# Patient Record
Sex: Female | Born: 1995 | Hispanic: Yes | Marital: Single | Smoking: Never smoker
Health system: Southern US, Community
[De-identification: ages and names within clinical notes are randomized; demographics above are authoritative.]

## PROBLEM LIST (undated history)

## (undated) ENCOUNTER — Inpatient Hospital Stay (HOSPITAL_COMMUNITY): Payer: Self-pay

## (undated) DIAGNOSIS — N390 Urinary tract infection, site not specified: Secondary | ICD-10-CM

## (undated) DIAGNOSIS — Z283 Underimmunization status: Secondary | ICD-10-CM

## (undated) DIAGNOSIS — N760 Acute vaginitis: Secondary | ICD-10-CM

## (undated) DIAGNOSIS — B9689 Other specified bacterial agents as the cause of diseases classified elsewhere: Secondary | ICD-10-CM

## (undated) DIAGNOSIS — O9989 Other specified diseases and conditions complicating pregnancy, childbirth and the puerperium: Secondary | ICD-10-CM

## (undated) HISTORY — DX: Other specified diseases and conditions complicating pregnancy, childbirth and the puerperium: O99.89

## (undated) HISTORY — DX: Other specified bacterial agents as the cause of diseases classified elsewhere: B96.89

## (undated) HISTORY — DX: Acute vaginitis: N76.0

## (undated) HISTORY — PX: NO PAST SURGERIES: SHX2092

## (undated) HISTORY — DX: Underimmunization status: Z28.3

---
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
## 2016-01-19 ENCOUNTER — Encounter: Payer: Self-pay | Admitting: Pediatrics

## 2016-01-20 ENCOUNTER — Other Ambulatory Visit: Payer: Self-pay | Admitting: Physician Assistant

## 2016-01-20 ENCOUNTER — Encounter: Payer: Self-pay | Admitting: Pediatrics

## 2016-01-20 DIAGNOSIS — N63 Unspecified lump in unspecified breast: Secondary | ICD-10-CM

## 2016-01-21 ENCOUNTER — Other Ambulatory Visit: Payer: Self-pay | Admitting: Physician Assistant

## 2016-01-21 ENCOUNTER — Ambulatory Visit
Admission: RE | Admit: 2016-01-21 | Discharge: 2016-01-21 | Disposition: A | Discharge status: Home / Self Care | Payer: No Typology Code available for payment source | Source: Ambulatory Visit | Attending: Physician Assistant | Admitting: Physician Assistant

## 2016-01-21 DIAGNOSIS — N6310 Unspecified lump in the right breast, unspecified quadrant: Secondary | ICD-10-CM

## 2016-12-21 ENCOUNTER — Other Ambulatory Visit: Payer: Self-pay | Admitting: Physician Assistant

## 2016-12-21 DIAGNOSIS — N63 Unspecified lump in unspecified breast: Secondary | ICD-10-CM

## 2017-01-01 ENCOUNTER — Other Ambulatory Visit: Payer: Self-pay | Admitting: Physician Assistant

## 2017-01-01 ENCOUNTER — Ambulatory Visit
Admission: RE | Admit: 2017-01-01 | Discharge: 2017-01-01 | Disposition: A | Discharge status: Home / Self Care | Payer: Self-pay | Source: Ambulatory Visit | Attending: Physician Assistant | Admitting: Physician Assistant

## 2017-01-01 DIAGNOSIS — N63 Unspecified lump in unspecified breast: Secondary | ICD-10-CM

## 2017-01-01 DIAGNOSIS — N6310 Unspecified lump in the right breast, unspecified quadrant: Secondary | ICD-10-CM

## 2017-03-01 ENCOUNTER — Other Ambulatory Visit: Payer: Self-pay | Admitting: Physician Assistant

## 2017-03-01 ENCOUNTER — Other Ambulatory Visit: Payer: Self-pay

## 2017-03-01 DIAGNOSIS — N6310 Unspecified lump in the right breast, unspecified quadrant: Secondary | ICD-10-CM

## 2017-03-06 ENCOUNTER — Ambulatory Visit
Admission: RE | Admit: 2017-03-06 | Discharge: 2017-03-06 | Disposition: A | Discharge status: Home / Self Care | Payer: PRIVATE HEALTH INSURANCE | Source: Ambulatory Visit | Attending: Physician Assistant | Admitting: Physician Assistant

## 2017-03-06 ENCOUNTER — Other Ambulatory Visit: Payer: Self-pay | Admitting: Physician Assistant

## 2017-03-06 DIAGNOSIS — N6310 Unspecified lump in the right breast, unspecified quadrant: Secondary | ICD-10-CM

## 2018-05-03 ENCOUNTER — Other Ambulatory Visit (HOSPITAL_COMMUNITY)
Admission: RE | Admit: 2018-05-03 | Discharge: 2018-05-03 | Disposition: A | Discharge status: Home / Self Care | Payer: PRIVATE HEALTH INSURANCE | Source: Ambulatory Visit | Attending: Physician Assistant | Admitting: Physician Assistant

## 2018-05-03 ENCOUNTER — Ambulatory Visit (INDEPENDENT_AMBULATORY_CARE_PROVIDER_SITE_OTHER): Payer: Self-pay | Admitting: Physician Assistant

## 2018-05-03 ENCOUNTER — Other Ambulatory Visit: Payer: Self-pay

## 2018-05-03 ENCOUNTER — Encounter (INDEPENDENT_AMBULATORY_CARE_PROVIDER_SITE_OTHER): Payer: Self-pay | Admitting: Physician Assistant

## 2018-05-03 VITALS — BP 106/68 | HR 77 | Temp 97.9°F | Ht <= 58 in | Wt 108.4 lb

## 2018-05-03 DIAGNOSIS — N898 Other specified noninflammatory disorders of vagina: Secondary | ICD-10-CM | POA: Insufficient documentation

## 2018-05-03 DIAGNOSIS — Z202 Contact with and (suspected) exposure to infections with a predominantly sexual mode of transmission: Secondary | ICD-10-CM

## 2018-05-03 DIAGNOSIS — N949 Unspecified condition associated with female genital organs and menstrual cycle: Secondary | ICD-10-CM | POA: Insufficient documentation

## 2018-05-03 MED ORDER — CEFTRIAXONE SODIUM 250 MG IJ SOLR
250.0000 mg | Freq: Once | INTRAMUSCULAR | Status: AC
  Administered 2018-05-03: 250 mg via INTRAMUSCULAR

## 2018-05-03 MED ORDER — DOXYCYCLINE HYCLATE 100 MG PO TABS: 100.0000 mg | ORAL_TABLET | Freq: Two times a day (BID) | ORAL | 0 refills | Status: DC

## 2018-05-03 NOTE — Progress Notes (Signed)
Subjective:  Patient ID: Candace Watkins, female    DOB: 06/27/1995  Age: 22 y.o. MRN: [id number]  CC: yeast infection  HPI Candace Watkins is a 22 y.o. female with a medical history of chlamydial infection and adjustment disorder with mixed depression and anxiety presents as a new patient with vaginal discharge, itching, and burning. Vaginal discharge yellowish in color. Has tried antifungal ovule with no relief of symptoms. Having sexual intercourse with female partner whom reportedly has untreated chlamydia. Does not endorse any other symptoms or complaints.        Outpatient Medications Prior to Visit  Medication Sig Dispense Refill  . FLUoxetine (PROZAC) 10 MG capsule Take 1 capsule (10 mg total) by mouth daily. (Patient not taking: Reported on 10/19/2014) 30 capsule 0   No facility-administered medications prior to visit.      ROS Review of Systems  Constitutional: Negative for chills, fever and malaise/fatigue.  Eyes: Negative for blurred vision.  Respiratory: Negative for shortness of breath.   Cardiovascular: Negative for chest pain and palpitations.  Gastrointestinal: Negative for abdominal pain and nausea.  Genitourinary: Negative for dysuria and hematuria.       Vaginal discharge and itching  Musculoskeletal: Negative for joint pain and myalgias.  Skin: Negative for rash.  Neurological: Negative for tingling and headaches.  Psychiatric/Behavioral: Negative for depression. The patient is not nervous/anxious.     Objective:  BP 106/68 (BP Location: Left Arm, Patient Position: Sitting, Cuff Size: Normal)   Pulse 77   Temp 97.9 F (36.6 C) (Oral)   Ht 4\' 10"  (1.473 m)   Wt 108 lb 6.4 oz (49.2 kg)   LMP 04/02/2018 (Exact Date)   SpO2 100%   BMI 22.66 kg/m   BP/Weight 05/03/2018 10/19/2014 91/02/1659  Systolic BP 600 - 90  Diastolic BP 68 - 58  Wt. (Lbs) 108.4 94 88  BMI 22.66 - 18.08      Physical Exam  Constitutional: She is oriented  to person, place, and time.  Well developed, well nourished, NAD, polite  HENT:  Head: Normocephalic and atraumatic.  Eyes: No scleral icterus.  Neck: Normal range of motion. Neck supple. No thyromegaly present.  Pulmonary/Chest: Effort normal.  Abdominal: Soft. Bowel sounds are normal. There is no tenderness.  Genitourinary:  Genitourinary Comments: No vulvar lesions, yellowish/greenish vaginal discharge, cervix erythematous with mild cervical motion tenderness, no adnexal mass or tenderness bilaterally, no uterine mass or tenderness.  Musculoskeletal: She exhibits no edema.  Neurological: She is alert and oriented to person, place, and time.  Skin: Skin is warm and dry. No rash noted. No erythema. No pallor.  Psychiatric: She has a normal mood and affect. Her behavior is normal. Thought content normal.  Vitals reviewed.    Assessment & Plan:   1. Exposure to STD - Administered cefTRIAXone (ROCEPHIN) injection 250 mg - Begin doxycycline (VIBRA-TABS) 100 MG tablet; Take 1 tablet (100 mg total) by mouth 2 (two) times daily.  Dispense: 20 tablet; Refill: 0  2. Vaginal burning - Cytology - PAP([place])  3. Vaginal discharge - Cytology - PAP([place])   Meds ordered this encounter  Medications  . cefTRIAXone (ROCEPHIN) injection 250 mg  . doxycycline (VIBRA-TABS) 100 MG tablet    Sig: Take 1 tablet (100 mg total) by mouth 2 (two) times daily.    Dispense:  20 tablet    Refill:  0    Order Specific Question:   Supervising Provider    Answer:   Margarita Rana,  ENOBONG [[id number]]    Follow-up: Return if symptoms worsen or fail to improve.   Clent Demark PA

## 2018-05-03 NOTE — Patient Instructions (Signed)
Cervicitis Cervicitis is when the cervix gets irritated and swollen. Your cervix is the lower end of your uterus. Follow these instructions at home:  Do not have sex until your doctor says it is okay.  Take over-the-counter and prescription medicines only as told by your doctor.  If you were prescribed an antibiotic medicine, take it as told by your doctor. Do not stop taking it even if you start to feel better.  Keep all follow-up visits as told by your doctor. This is important. Contact a doctor if:  Your symptoms come back after treatment.  Your symptoms get worse after treatment.  You have a fever.  You feel tired (fatigued).  Your belly (abdomen) hurts.  You feel like you are going to throw up (are nauseous).  You throw up (vomit).  You have watery poop (diarrhea).  Your back hurts. Get help right away if:  You have very bad pain in your belly, and medicine does not help it.  You cannot pee (urinate). Summary  Cervicitis is when the cervix gets irritated and swollen.  Do not have sex until your doctor says it is okay.  If you need to take an antibiotic, do not stop taking even if you start to feel better. Take medicines only as told by your doctor. This information is not intended to replace advice given to you by your health care provider. Make sure you discuss any questions you have with your health care provider. Document Released: 03/21/2008 Document Revised: 02/27/2016 Document Reviewed: 02/27/2016 Elsevier Interactive Patient Education  2017 [employer].

## 2018-05-08 ENCOUNTER — Telehealth (INDEPENDENT_AMBULATORY_CARE_PROVIDER_SITE_OTHER): Payer: Self-pay

## 2018-05-08 LAB — CYTOLOGY - PAP
Bacterial vaginitis: NEGATIVE
Candida vaginitis: NEGATIVE
Chlamydia: NEGATIVE
Diagnosis: NEGATIVE
Neisseria Gonorrhea: NEGATIVE
TRICH (WINDOWPATH): NEGATIVE

## 2018-05-08 NOTE — Telephone Encounter (Signed)
-----   Message from Clent Demark, PA-C sent at 05/08/2018  2:00 PM EST ----- Pap is completely negative.

## 2018-05-08 NOTE — Telephone Encounter (Signed)
Have partner get tested and stop having sex with anyone suspected of having an STD.

## 2018-05-08 NOTE — Telephone Encounter (Signed)
Patient is aware that pap is completely negative. She did pick up abx but never began taking it. She is still having sex with the same partner. He has never been tested or treated for chlamydia. Patient wants to know if she should still take abx. Please advise. CMA did advise patient to have partner get tested. Nat Christen, CMA

## 2018-05-09 NOTE — Telephone Encounter (Signed)
Patient informed. Trig Mcbryar S Christy Friede, CMA  

## 2018-11-20 ENCOUNTER — Telehealth: Payer: Self-pay

## 2018-11-20 DIAGNOSIS — Z20822 Contact with and (suspected) exposure to covid-19: Secondary | ICD-10-CM
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
## 2018-11-21 ENCOUNTER — Other Ambulatory Visit: Payer: Self-pay

## 2018-11-21 DIAGNOSIS — Z20822 Contact with and (suspected) exposure to covid-19: Secondary | ICD-10-CM

## 2018-11-22 LAB — NOVEL CORONAVIRUS, NAA: SARS-CoV-2, NAA: NOT DETECTED

## 2018-11-26 ENCOUNTER — Telehealth: Payer: Self-pay

## 2018-11-26 NOTE — Telephone Encounter (Signed)
Patient called and asked if her family member could call this number to be tested, a child under 40. I advised to contact the child's PCP, if no PCP contact the [facility] Department, she verbalized understanding.

## 2020-12-03 ENCOUNTER — Telehealth: Payer: Self-pay

## 2020-12-03 NOTE — Telephone Encounter (Signed)
Telephoned patient at home number. Left a voice message with [facility] contact information. 

## 2020-12-08 ENCOUNTER — Other Ambulatory Visit: Payer: Self-pay

## 2020-12-08 DIAGNOSIS — N63 Unspecified lump in unspecified breast: Secondary | ICD-10-CM

## 2020-12-23 ENCOUNTER — Ambulatory Visit: Payer: Self-pay

## 2020-12-23 ENCOUNTER — Other Ambulatory Visit: Payer: Self-pay

## 2021-08-31 ENCOUNTER — Emergency Department (HOSPITAL_COMMUNITY): Payer: Self-pay

## 2021-08-31 ENCOUNTER — Emergency Department (HOSPITAL_COMMUNITY)
Admission: EM | Admit: 2021-08-31 | Discharge: 2021-08-31 | Disposition: A | Discharge status: Home / Self Care | Payer: Self-pay | Attending: Emergency Medicine | Admitting: Emergency Medicine

## 2021-08-31 DIAGNOSIS — R1031 Right lower quadrant pain: Secondary | ICD-10-CM | POA: Insufficient documentation

## 2021-08-31 DIAGNOSIS — R102 Pelvic and perineal pain: Secondary | ICD-10-CM | POA: Insufficient documentation

## 2021-08-31 LAB — URINALYSIS, ROUTINE W REFLEX MICROSCOPIC
Bacteria, UA: NONE SEEN
Bilirubin Urine: NEGATIVE
Glucose, UA: NEGATIVE mg/dL
Ketones, ur: NEGATIVE mg/dL
Nitrite: NEGATIVE
Protein, ur: NEGATIVE mg/dL
Specific Gravity, Urine: 1.02 (ref 1.005–1.030)
pH: 7 (ref 5.0–8.0)

## 2021-08-31 LAB — COMPREHENSIVE METABOLIC PANEL
ALT: 45 U/L — ABNORMAL HIGH (ref 0–44)
AST: 55 U/L — ABNORMAL HIGH (ref 15–41)
Albumin: 4 g/dL (ref 3.5–5.0)
Alkaline Phosphatase: 62 U/L (ref 38–126)
Anion gap: 7 (ref 5–15)
BUN: 10 mg/dL (ref 6–20)
CO2: 24 mmol/L (ref 22–32)
Calcium: 9 mg/dL (ref 8.9–10.3)
Chloride: 108 mmol/L (ref 98–111)
Creatinine, Ser: 0.51 mg/dL (ref 0.44–1.00)
GFR, Estimated: >60 mL/min (ref >60)
Glucose, Bld: 104 mg/dL — ABNORMAL HIGH (ref 70–99)
Potassium: 3.6 mmol/L (ref 3.5–5.1)
Sodium: 139 mmol/L (ref 135–145)
Total Bilirubin: 0.1 mg/dL — ABNORMAL LOW (ref 0.3–1.2)
Total Protein: 7.8 g/dL (ref 6.5–8.1)

## 2021-08-31 LAB — CBC WITH DIFFERENTIAL/PLATELET
Abs Immature Granulocytes: 0.02 10*3/uL (ref 0.00–0.07)
Basophils Absolute: 0.1 10*3/uL (ref 0.0–0.1)
Basophils Relative: 1 %
Eosinophils Absolute: 0.1 10*3/uL (ref 0.0–0.5)
Eosinophils Relative: 1 %
HCT: 26.6 % — ABNORMAL LOW (ref 36.0–46.0)
Hemoglobin: 7 g/dL — ABNORMAL LOW (ref 12.0–15.0)
Immature Granulocytes: 0 %
Lymphocytes Relative: 38 %
Lymphs Abs: 2.5 10*3/uL (ref 0.7–4.0)
MCH: 17.3 pg — ABNORMAL LOW (ref 26.0–34.0)
MCHC: 26.3 g/dL — ABNORMAL LOW (ref 30.0–36.0)
MCV: 65.7 fL — ABNORMAL LOW (ref 80.0–100.0)
Monocytes Absolute: 0.6 10*3/uL (ref 0.1–1.0)
Monocytes Relative: 10 %
Neutro Abs: 3.2 10*3/uL (ref 1.7–7.7)
Neutrophils Relative %: 50 %
Platelets: 429 10*3/uL — ABNORMAL HIGH (ref 150–400)
RBC: 4.05 MIL/uL (ref 3.87–5.11)
RDW: 19.2 % — ABNORMAL HIGH (ref 11.5–15.5)
WBC: 6.5 10*3/uL (ref 4.0–10.5)
nRBC: 0 % (ref 0.0–0.2)

## 2021-08-31 LAB — I-STAT BETA HCG BLOOD, ED (MC, WL, AP ONLY): I-stat hCG, quantitative: <5 m[IU]/mL (ref <5)

## 2021-08-31 LAB — WET PREP, GENITAL
Clue Cells Wet Prep HPF POC: NONE SEEN
Sperm: NONE SEEN
Trich, Wet Prep: NONE SEEN
WBC, Wet Prep HPF POC: <10 (ref <10)
Yeast Wet Prep HPF POC: NONE SEEN

## 2021-08-31 LAB — LIPASE, BLOOD: Lipase: 38 U/L (ref 11–51)

## 2021-08-31 MED ORDER — IOHEXOL 300 MG/ML  SOLN
80.0000 mL | Freq: Once | INTRAMUSCULAR | Status: AC | PRN
  Administered 2021-08-31: 80 mL via INTRAVENOUS

## 2021-08-31 MED ORDER — IBUPROFEN 800 MG PO TABS
800.0000 mg | ORAL_TABLET | Freq: Once | ORAL | Status: AC
  Administered 2021-08-31: 800 mg via ORAL
  Filled 2021-08-31: qty 1

## 2021-08-31 MED ORDER — NAPROXEN 500 MG PO TABS: 500.0000 mg | ORAL_TABLET | Freq: Two times a day (BID) | ORAL | 0 refills | Status: DC

## 2021-08-31 NOTE — ED Provider Triage Note (Signed)
Emergency Medicine Provider Triage Evaluation Note ? ?Marni Griffon , a 26 y.o. female  was evaluated in triage.  Pt complains of right lower quadrant abdominal pain, began on Monday, constant in nature.  Does not radiate.  Does have some white vaginal discharge.  Denies any pelvic changes.  No fever, emesis, dysuria, hematuria.  Denies chance of pregnancy. ? ?Review of Systems  ?Positive: Right lower quadrant abdominal pain ?Negative: Nausea, vomiting, diarrhea ? ?Physical Exam  ?BP 122/71   Pulse 95   Temp 98 ?F (36.7 ?C)   Resp 18   Ht '4\' 11"'$  (1.499 m)   Wt 55.3 kg   SpO2 97%   BMI 24.64 kg/m?  ?Gen:   Awake, no distress   ?Resp:  Normal effort  ?MSK:   Moves extremities without difficulty  ?ABD:  Tenderness to RLQ ?Other:   ? ?Medical Decision Making  ?Medically screening exam initiated at 7:07 PM.  Appropriate orders placed.  Jazzlene Huot was informed that the remainder of the evaluation will be completed by another provider, this initial triage assessment does not replace that evaluation, and the importance of remaining in the ED until their evaluation is complete. ? ?RLQ abd pain ?  ?English Craighead A, PA-C ?08/31/21 1908 ? ?

## 2021-08-31 NOTE — ED Provider Notes (Signed)
[facility] DEPT Provider Note   CSN: [id number] Arrival date & time: 08/31/21  1850    History  Chief Complaint  Patient presents with   Abdominal Pain    Candace Watkins is a 26 y.o. female here for evaluation of right lower quadrant pain.  Began on Monday.  Constant in nature.  Does admit to some white vaginal discharge.  Does not radiate to back.  She denies any fever, chills, nausea, vomiting, abnormal vaginal bleeding.  Does states she has heavy menstrual cycles at baseline.  She denies prior history of anemia.  Pain not worse with movement.  Normal bowel movements.  HPI     Home Medications Prior to Admission medications   Medication Sig Start Date End Date Taking? Authorizing Provider  naproxen (NAPROSYN) 500 MG tablet Take 1 tablet (500 mg total) by mouth 2 (two) times daily. 08/31/21  Yes Tamira Ryland A, PA-C  doxycycline (VIBRA-TABS) 100 MG tablet Take 1 tablet (100 mg total) by mouth 2 (two) times daily. 05/03/18   Clent Demark, PA-C      Allergies    Patient has no known allergies.    Review of Systems   Review of Systems  Constitutional: Negative.   HENT: Negative.    Respiratory: Negative.    Cardiovascular: Negative.   Gastrointestinal:  Positive for abdominal pain (rlq pain). Negative for abdominal distention, anal bleeding, blood in stool, constipation, diarrhea, nausea, rectal pain and vomiting.  Genitourinary:  Positive for vaginal discharge.  Musculoskeletal: Negative.   Skin: Negative.   Neurological: Negative.   All other systems reviewed and are negative.  Physical Exam Updated Vital Signs BP 116/73    Pulse 65    Temp 98 F (36.7 C)    Resp 18    Ht $R'4\' 11"'ph$  (1.499 m)    Wt 55.3 kg    SpO2 100%    BMI 24.64 kg/m  Physical Exam Vitals and nursing note reviewed.  Constitutional:      General: She is not in acute distress.    Appearance: She is well-developed. She is not ill-appearing,  toxic-appearing or diaphoretic.  HENT:     Head: Normocephalic and atraumatic.     Mouth/Throat:     Mouth: Mucous membranes are moist.  Eyes:     Pupils: Pupils are equal, round, and reactive to light.  Cardiovascular:     Rate and Rhythm: Normal rate.     Heart sounds: Normal heart sounds.  Pulmonary:     Effort: Pulmonary effort is normal. No respiratory distress.     Breath sounds: Normal breath sounds.  Abdominal:     General: There is no distension.     Tenderness: There is abdominal tenderness in the right lower quadrant. There is no right CVA tenderness, left CVA tenderness, guarding or rebound. Negative signs include Murphy's sign.     Hernia: No hernia is present.  Genitourinary:    Comments: Normal appearing external female genitalia without rashes or lesions, normal vaginal epithelium. Normal appearing cervix with scant discharge or petechiae. Cervical os is closed. There is no bleeding noted at the os. no Odor. Bimanual: No CMT, non-tender.  No palpable adnexal masses or tenderness. Uterus midline and not fixed. Rectovaginal exam was deferred.  No cystocele or rectocele noted. No pelvic lymphadenopathy noted. Wet prep was obtained.  Cultures for gonorrhea and chlamydia collected. Exam performed with chaperone in room.   Musculoskeletal:        General: Normal  range of motion.     Cervical back: Normal range of motion.  Skin:    General: Skin is warm and dry.  Neurological:     General: No focal deficit present.     Mental Status: She is alert.  Psychiatric:        Mood and Affect: Mood normal.    ED Results / Procedures / Treatments   Labs (all labs ordered are listed, but only abnormal results are displayed) Labs Reviewed  CBC WITH DIFFERENTIAL/PLATELET - Abnormal; Notable for the following components:      Result Value   Hemoglobin 7.0 (*)    HCT 26.6 (*)    MCV 65.7 (*)    MCH 17.3 (*)    MCHC 26.3 (*)    RDW 19.2 (*)    Platelets 429 (*)    All other  components within normal limits  COMPREHENSIVE METABOLIC PANEL - Abnormal; Notable for the following components:   Glucose, Bld 104 (*)    AST 55 (*)    ALT 45 (*)    Total Bilirubin 0.1 (*)    All other components within normal limits  URINALYSIS, ROUTINE W REFLEX MICROSCOPIC - Abnormal; Notable for the following components:   Color, Urine STRAW (*)    Hgb urine dipstick SMALL (*)    Leukocytes,Ua MODERATE (*)    All other components within normal limits  WET PREP, GENITAL  LIPASE, BLOOD  I-STAT BETA HCG BLOOD, ED ([facility], [facility], [facility] ONLY)  GC/CHLAMYDIA PROBE AMP ([employer]) NOT AT [facility]    EKG None  Radiology CT ABDOMEN PELVIS W CONTRAST  Result Date: 08/31/2021 CLINICAL DATA:  Right lower quadrant pain. EXAM: CT ABDOMEN AND PELVIS WITH CONTRAST TECHNIQUE: Multidetector CT imaging of the abdomen and pelvis was performed using the standard protocol following bolus administration of intravenous contrast. RADIATION DOSE REDUCTION: This exam was performed according to the departmental dose-optimization program which includes automated exposure control, adjustment of the mA and/or kV according to patient size and/or use of iterative reconstruction technique. CONTRAST:  40mL OMNIPAQUE IOHEXOL 300 MG/ML  SOLN COMPARISON:  None. FINDINGS: Lower chest: No acute abnormality. Hepatobiliary: No focal liver abnormality is seen. No gallstones, gallbladder wall thickening, or biliary dilatation. Pancreas: Unremarkable. No pancreatic ductal dilatation or surrounding inflammatory changes. Spleen: Normal in size without focal abnormality. Adrenals/Urinary Tract: Adrenal glands are unremarkable. Kidneys are normal, without renal calculi, focal lesion, or hydronephrosis. Bladder is unremarkable. Stomach/Bowel: Stomach is within normal limits. Appendix appears normal. No evidence of bowel wall thickening, distention, or inflammatory changes. Vascular/Lymphatic: No significant vascular findings are present. No enlarged  abdominal or pelvic lymph nodes. Reproductive: Uterus and bilateral adnexa are unremarkable. Other: No abdominal wall hernia or abnormality. No abdominopelvic ascites. Musculoskeletal: No acute or significant osseous findings. IMPRESSION: No acute or active process within the abdomen or pelvis. Electronically Signed   By: Virgina Norfolk M.D.   On: 08/31/2021 21:02   US PELVIC COMPLETE W TRANSVAGINAL AND TORSION R/O  Result Date: 08/31/2021 CLINICAL DATA:  Pelvic pain eccentric to the right. EXAM: TRANSABDOMINAL AND TRANSVAGINAL ULTRASOUND OF PELVIS DOPPLER ULTRASOUND OF OVARIES TECHNIQUE: Both transabdominal and transvaginal ultrasound examinations of the pelvis were performed. Transabdominal technique was performed for global imaging of the pelvis including uterus, ovaries, adnexal regions, and pelvic cul-de-sac. It was necessary to proceed with endovaginal exam following the transabdominal exam to visualize the ovaries and adnexal spaces better. Color and duplex Doppler ultrasound was utilized to evaluate blood flow to the ovaries.  COMPARISON:  The CT with IV contrast earlier today and the pelvic ultrasound 04/06/2014 FINDINGS: Uterus Measurements: 6.2 x 2.8 x 4.5 cm = volume: 41.5 mL. No fibroids or other mass visualized. The cervix is unremarkable measuring 2.1 cm length. Endometrium Thickness: 5 mm.  No focal abnormality visualized. Right ovary Measurements: 3.7 x 2.1 x 2.4 cm = volume: 10.0 mL. Normal appearance/no adnexal mass. There are small scattered simple follicles. Left ovary Measurements: 3.0 x 2.0 x 1.6 cm = volume: 5.0 mL. Normal appearance/no adnexal mass. There are small scattered simple follicles. Pulsed Doppler evaluation of both ovaries demonstrates normal low-resistance arterial and venous waveforms. Other findings There is a small amount of anechoic free fluid adjacent to both ovaries. IMPRESSION: 1. Unremarkable uterus and ovaries. 2. Small amount of free fluid, which appears anechoic  and uncomplicated. This is nonspecific but frequently physiologic at this patient's age. This was also seen on the prior ultrasound in 2015. Electronically Signed   By: Almira Bar M.D.   On: 08/31/2021 22:44    Procedures Procedures    Medications Ordered in ED Medications  iohexol (OMNIPAQUE) 300 MG/ML solution 80 mL (80 mLs Intravenous Contrast Given 08/31/21 2044)  ibuprofen (ADVIL) tablet 800 mg (800 mg Oral Given 08/31/21 2318)   ED Course/ Medical Decision Making/ A&P    25 here for evaluation of right lower quadrant pain which began on Monday.  Pain constant nature.  Does not radiate.  Has noted some white vaginal discharge however denies any pelvic pain, abnormal vaginal bleeding.  No changes in bowel movements.  Pain does not radiate.  Labs and imaging personally viewed and interpreted:  CBC without leukocytosis, hemoglobin 7.0, no recent to compare, patient does admit to heavy menstrual cycles at baseline.  Not currently menstruating. Metabolic panel glucose 104, AST 55, ALT 45, normal Tbili and alk phos UA neg for infection Preg neg Lipase 38 CT [facility] without acute abnormality US pelvic with small amount of free fluid in pelvis, uncomplicated  GU exam with scant discharge in vaginal vault.  No CMT or adnexal tenderness  Patient is nontoxic, nonseptic appearing, in no apparent distress.  Patient's pain and other symptoms adequately managed in emergency department.  Fluid bolus given.  Labs, imaging and vitals reviewed.  Patient does not meet the SIRS or Sepsis criteria.  On repeat exam patient does not have a surgical abdomin and there are no peritoneal signs.  No indication of appendicitis, bowel obstruction, bowel perforation, cholecystitis, diverticulitis, PID, torsion, TOA.  Patient discharged home with symptomatic treatment and given strict instructions for follow-up with their primary care physician.  I have also discussed reasons to return immediately to the ER.  Patient  expresses understanding and agrees with plan.                            Medical Decision Making Amount and/or Complexity of Data Reviewed Labs: ordered. Radiology: ordered. ECG/medicine tests: ordered.  Risk Prescription drug management.         Final Clinical Impression(s) / ED Diagnoses Final diagnoses:  Pelvic pain  RLQ abdominal pain    Rx / DC Orders ED Discharge Orders          Ordered    naproxen (NAPROSYN) 500 MG tablet  2 times daily        08/31/21 2315              Cythina Mickelsen A, PA-C 08/31/21 2325  Gareth Morgan, MD 09/01/21 0001

## 2021-08-31 NOTE — Discharge Instructions (Addendum)
Your CT scan and your ultrasound did not show any significant abnormality.  It is unclear why you are having this abdominal pain it could possible be due to a rupture cyst.  I have written you for a medication to help with your symptoms.  Follow-up with primary care provider return for new or worsening symptoms ?

## 2021-08-31 NOTE — ED Triage Notes (Signed)
Pt states she has right lower abdominal pain that started 2 days ago. Pt reports nausea. ?

## 2021-09-01 LAB — GC/CHLAMYDIA PROBE AMP (~~LOC~~) NOT AT ARMC
Chlamydia: NEGATIVE
Comment: NEGATIVE
Comment: NORMAL
Neisseria Gonorrhea: NEGATIVE

## 2021-09-21 ENCOUNTER — Other Ambulatory Visit: Payer: Self-pay

## 2021-09-21 ENCOUNTER — Emergency Department (HOSPITAL_COMMUNITY)
Admission: EM | Admit: 2021-09-21 | Discharge: 2021-09-21 | Disposition: A | Discharge status: Home / Self Care | Payer: Self-pay | Attending: Emergency Medicine | Admitting: Emergency Medicine

## 2021-09-21 ENCOUNTER — Emergency Department (HOSPITAL_COMMUNITY): Payer: Self-pay

## 2021-09-21 DIAGNOSIS — S0990XA Unspecified injury of head, initial encounter: Secondary | ICD-10-CM | POA: Diagnosis present

## 2021-09-21 DIAGNOSIS — Y92410 Unspecified street and highway as the place of occurrence of the external cause: Secondary | ICD-10-CM | POA: Diagnosis not present

## 2021-09-21 DIAGNOSIS — S0083XA Contusion of other part of head, initial encounter: Secondary | ICD-10-CM | POA: Insufficient documentation

## 2021-09-21 DIAGNOSIS — N3000 Acute cystitis without hematuria: Secondary | ICD-10-CM | POA: Insufficient documentation

## 2021-09-21 DIAGNOSIS — S8002XA Contusion of left knee, initial encounter: Secondary | ICD-10-CM | POA: Insufficient documentation

## 2021-09-21 DIAGNOSIS — S299XXA Unspecified injury of thorax, initial encounter: Secondary | ICD-10-CM | POA: Insufficient documentation

## 2021-09-21 DIAGNOSIS — S3991XA Unspecified injury of abdomen, initial encounter: Secondary | ICD-10-CM | POA: Insufficient documentation

## 2021-09-21 LAB — URINALYSIS, ROUTINE W REFLEX MICROSCOPIC
Bilirubin Urine: NEGATIVE
Glucose, UA: NEGATIVE mg/dL
Ketones, ur: NEGATIVE mg/dL
Nitrite: POSITIVE — AB
Protein, ur: NEGATIVE mg/dL
Specific Gravity, Urine: 1.025 (ref 1.005–1.030)
pH: 6.5 (ref 5.0–8.0)

## 2021-09-21 LAB — COMPREHENSIVE METABOLIC PANEL
ALT: 26 U/L (ref 0–44)
AST: 24 U/L (ref 15–41)
Albumin: 3.7 g/dL (ref 3.5–5.0)
Alkaline Phosphatase: 63 U/L (ref 38–126)
Anion gap: 8 (ref 5–15)
BUN: 17 mg/dL (ref 6–20)
CO2: 23 mmol/L (ref 22–32)
Calcium: 9.3 mg/dL (ref 8.9–10.3)
Chloride: 105 mmol/L (ref 98–111)
Creatinine, Ser: 0.74 mg/dL (ref 0.44–1.00)
GFR, Estimated: >60 mL/min (ref >60)
Glucose, Bld: 94 mg/dL (ref 70–99)
Potassium: 3.9 mmol/L (ref 3.5–5.1)
Sodium: 136 mmol/L (ref 135–145)
Total Bilirubin: 0.6 mg/dL (ref 0.3–1.2)
Total Protein: 7.2 g/dL (ref 6.5–8.1)

## 2021-09-21 LAB — URINALYSIS, MICROSCOPIC (REFLEX)

## 2021-09-21 LAB — PREGNANCY, URINE: Preg Test, Ur: NEGATIVE

## 2021-09-21 MED ORDER — NITROFURANTOIN MONOHYD MACRO 100 MG PO CAPS: 100.0000 mg | ORAL_CAPSULE | Freq: Two times a day (BID) | ORAL | 0 refills | Status: DC

## 2021-09-21 MED ORDER — FLUCONAZOLE 200 MG PO TABS: 200.0000 mg | ORAL_TABLET | Freq: Every day | ORAL | 0 refills | Status: AC

## 2021-09-21 MED ORDER — IBUPROFEN 600 MG PO TABS
600.0000 mg | ORAL_TABLET | Freq: Four times a day (QID) | ORAL | 0 refills | Status: DC | PRN
Start: 2021-09-21 — End: 2023-01-15

## 2021-09-21 MED ORDER — METHOCARBAMOL 500 MG PO TABS
500.0000 mg | ORAL_TABLET | Freq: Two times a day (BID) | ORAL | 0 refills | Status: DC
Start: 2021-09-21 — End: 2023-01-15

## 2021-09-21 MED ORDER — IOHEXOL 300 MG/ML  SOLN
100.0000 mL | Freq: Once | INTRAMUSCULAR | Status: AC | PRN
  Administered 2021-09-21: 100 mL via INTRAVENOUS

## 2021-09-21 NOTE — ED Notes (Signed)
Pt verbalized understanding of d/c instructions, meds, and followup care. Denies questions. VSS, no distress noted. Steady gait to exit with all belongings.  ?
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
## 2021-12-22 DIAGNOSIS — Z3A11 11 weeks gestation of pregnancy: Secondary | ICD-10-CM | POA: Insufficient documentation

## 2021-12-22 DIAGNOSIS — Z640 Problems related to unwanted pregnancy: Secondary | ICD-10-CM | POA: Insufficient documentation

## 2023-01-15 ENCOUNTER — Encounter (HOSPITAL_BASED_OUTPATIENT_CLINIC_OR_DEPARTMENT_OTHER): Payer: Self-pay | Admitting: Family Medicine

## 2023-01-15 ENCOUNTER — Ambulatory Visit (INDEPENDENT_AMBULATORY_CARE_PROVIDER_SITE_OTHER): Payer: Self-pay | Admitting: Family Medicine

## 2023-01-15 VITALS — BP 107/75 | HR 64 | Ht 59.0 in | Wt 126.1 lb

## 2023-01-15 DIAGNOSIS — Z01818 Encounter for other preprocedural examination: Secondary | ICD-10-CM | POA: Insufficient documentation

## 2023-01-15 DIAGNOSIS — Z7689 Persons encountering health services in other specified circumstances: Secondary | ICD-10-CM | POA: Insufficient documentation

## 2023-01-15 NOTE — Assessment & Plan Note (Signed)
Patient presents today for pre-operative clearance. Patient in no acute distress and is well-appearing. Denies chest pain, shortness of breath, lower extremity edema, vision changes, headaches. Cardiovascular exam with heart regular rate and rhythm. Normal heart sounds, no murmurs present. No lower extremity edema present. Lungs clear to auscultation bilaterally. Reviewed required labs from plastic surgeon- will obtain today and update patient with results. Once results return, will fax labs and surgical clearance paperwork to office. EKG completed in office today, sinus arrhythmia at 63. No acute concerns. Order for CXR placed.

## 2023-01-15 NOTE — Progress Notes (Signed)
Subjective:    Candace Watkins 30-Oct-1995 01/15/2023  HPI: Patient is a 27 y.o. female who is here to establish care and is interested in receiving preoperative clearance for [place] butt-lift surgery. She reports it is currently scheduled for August 9th. Patient provided surgical clearance paperwork to be completed and faxed.   Past medical history includes: none Medications: none  Past surgical history: none  NKDA  1) High Risk Cardiac Conditions:  1) Recent MI - No.  2) Decompensated Heart Failure - No.  3) Unstable angina - No.  4) Symptomatic arrythmia - No.  5) Sx Valvular Disease - No.  2) Intermediate Risk Factors: DM, CKD, CVA, CHF, CAD - No.  2) Functional Status: > 4 mets (Walk, run, climb stairs) Yes.  Candace Watkins Activity Status Index: 58.2 points   3) Surgery Specific Risk: Intermediate      4) Further Noninvasive evaluation:   1) EKG - Yes.  Requested by Engineer, petroleum.   No PMHx of MI, CVA, CAD, DM, CKD  2) Echo - No.  3) Stress Testing - No. No active cardiac disease.  4) CXR Yes.  Requested by surgeon.    If asymptomatic, healthy, no respiratory symptoms it is not needed  5) PFTs No.   No history of OSA, OHS, or significant cardiopulmonary history  5) Need for medical therapy - Beta Blocker, Statins indicated ? No.  New complaints: None, no concerns today   Social history:  Relevant past medical, surgical, family and social history reviewed and updated as indicated.  Allergies and medications reviewed and updated.  Patient Active Problem List   Diagnosis Date Noted   Encounter to establish care 01/15/2023   Pre-operative clearance 01/15/2023   Past Medical History:  Diagnosis Date   Anemia 07/05/2013   Started on iron 1/10     Presence of subdermal contraceptive device 11/13/2013   Rubella non-immune status, antepartum 07/09/2013   Past Surgical History:  Procedure Laterality Date   NO PAST SURGERIES     Family History   Problem Relation Age of Onset   Diabetes Mother    Hypertension Mother    Hyperlipidemia Father    Diabetes Paternal Grandmother    Outpatient Medications Prior to Visit  Medication Sig Dispense Refill   ibuprofen (ADVIL) 600 MG tablet Take 1 tablet (600 mg total) by mouth every 6 (six) hours as needed. (Patient not taking: Reported on 01/15/2023) 30 tablet 0   methocarbamol (ROBAXIN) 500 MG tablet Take 1 tablet (500 mg total) by mouth 2 (two) times daily. (Patient not taking: Reported on 01/15/2023) 14 tablet 0   nitrofurantoin, macrocrystal-monohydrate, (MACROBID) 100 MG capsule Take 1 capsule (100 mg total) by mouth 2 (two) times daily. (Patient not taking: Reported on 01/15/2023) 10 capsule 0   No facility-administered medications prior to visit.   No Known Allergies  DATA REVIEWED: CHART IN EPIC  ROS: Negative unless specifically indicated above in HPI.   No current outpatient medications on file.      Objective:    Today's Vitals   01/15/23 0818  BP: 107/75  Pulse: 64  Weight: 126 lb 1.6 oz (57.2 kg)  Height: 4\' 11"  (1.499 m)    Wt Readings from Last 3 Encounters:  01/15/23 126 lb 1.6 oz (57.2 kg)  09/21/21 125 lb (56.7 kg)  08/31/21 122 lb (55.3 kg)    GENERAL: Well-appearing, in NAD. Well nourished.  SKIN: Pink, warm and dry. No rash, lesion, ulceration, or ecchymoses.  Head: Normocephalic. NECK: Trachea midline. Full ROM w/o pain or tenderness. No lymphadenopathy.  EARS: Tympanic membranes are intact, translucent without bulging and without drainage. Appropriate landmarks visualized.  EYES: Conjunctiva clear without exudates. EOMI, PERRL, no drainage present.  THROAT: Uvula midline. Oropharynx clear. Tonsils non-inflamed without exudate. Mucous membranes pink and moist.  RESPIRATORY: Chest wall symmetrical. Respirations even and non-labored. Breath sounds clear to auscultation bilaterally.  CARDIAC: S1, S2 present, regular rate and rhythm without murmur or  gallops. Peripheral pulses 2+ bilaterally.  MSK: Muscle tone and strength appropriate for age. Joints w/o tenderness, redness, or swelling.  EXTREMITIES: Without clubbing, cyanosis, or edema.  NEUROLOGIC: No motor or sensory deficits. Steady, even gait. C2-C12 intact.  PSYCH/MENTAL STATUS: Alert, oriented x 3. Cooperative, appropriate mood and affect.    No results found for any visits on 01/15/23.  The ASCVD Risk score (Arnett DK, et al., 2019) failed to calculate for the following reasons:   The 2019 ASCVD risk score is only valid for ages 70 to 78      Assessment & Plan:  Encounter to establish care Assessment & Plan: Patient is a pleasant 27 year-old female who presents today to establish care with new primary care provider. Reviewed the past medical history, family history, social history, surgical history, and allergies today- updates made as indicated. Patient is here today to obtain surgical clearance for [place] butt lift, scheduled for 02/02/2023. Patient has no concerns today.    Pre-operative clearance Assessment & Plan: Patient presents today for pre-operative clearance. Patient in no acute distress and is well-appearing. Denies chest pain, shortness of breath, lower extremity edema, vision changes, headaches. Cardiovascular exam with heart regular rate and rhythm. Normal heart sounds, no murmurs present. No lower extremity edema present. Lungs clear to auscultation bilaterally. Reviewed required labs from plastic surgeon- will obtain today and update patient with results. Once results return, will fax labs and surgical clearance paperwork to office. EKG completed in office today, sinus arrhythmia at 63. No acute concerns. Order for CXR placed.     Orders: -     Comprehensive metabolic panel -     CBC with Differential/Platelet -     PT and PTT -     Rapid HIV screen (HIV 1/2 Ab+Ag) -     hCG, quantitative, pregnancy -     Hemoglobin A1c -     TSH+T4F+T3Free -      Urinalysis, Routine w reflex microscopic -     EKG 12-Lead -     DG Chest 2 View -     EKG 12-Lead    I have independently evaluated patient.  Candace Watkins is a 27 y.o. female who is low risk for an intermediate-risk surgery.  There are not modifiable risk factors (smoking, etc).   Return in about 4 months (around 05/18/2023) for Physical.  Patient to reach out to office if new, worrisome, or unresolved symptoms arise or if no improvement in patient's condition. Patient verbalized understanding and is agreeable to treatment plan. All questions answered to patient's satisfaction.    Alyson Reedy, FNP-C

## 2023-01-15 NOTE — Assessment & Plan Note (Addendum)
Patient is a pleasant 27 year-old female who presents today to establish care with new primary care provider. Reviewed the past medical history, family history, social history, surgical history, and allergies today- updates made as indicated. Patient is here today to obtain surgical clearance for [place] butt lift, scheduled for 02/02/2023. Patient has no concerns today.

## 2023-01-16 ENCOUNTER — Other Ambulatory Visit (HOSPITAL_BASED_OUTPATIENT_CLINIC_OR_DEPARTMENT_OTHER): Payer: Self-pay | Admitting: Family Medicine

## 2023-01-16 LAB — URINALYSIS, ROUTINE W REFLEX MICROSCOPIC
Bilirubin, UA: NEGATIVE
Glucose, UA: NEGATIVE
Ketones, UA: NEGATIVE
Nitrite, UA: POSITIVE — AB
Specific Gravity, UA: >=1.03 — AB (ref 1.005–1.030)
Urobilinogen, Ur: 0.2 mg/dL (ref 0.2–1.0)
pH, UA: 5.5 (ref 5.0–7.5)

## 2023-01-16 LAB — MICROSCOPIC EXAMINATION
Casts: NONE SEEN /lpf
Epithelial Cells (non renal): >10 /hpf — AB (ref 0–10)

## 2023-01-16 LAB — CBC WITH DIFFERENTIAL/PLATELET
Basophils Absolute: 0.1 10*3/uL (ref 0.0–0.2)
Basos: 1 %
EOS (ABSOLUTE): 0.1 10*3/uL (ref 0.0–0.4)
Eos: 1 %
Hematocrit: 33.5 % — ABNORMAL LOW (ref 34.0–46.6)
Hemoglobin: 9.3 g/dL — ABNORMAL LOW (ref 11.1–15.9)
Immature Grans (Abs): 0 10*3/uL (ref 0.0–0.1)
Immature Granulocytes: 0 %
Lymphocytes Absolute: 1.8 10*3/uL (ref 0.7–3.1)
Lymphs: 19 %
MCH: 19 pg — ABNORMAL LOW (ref 26.6–33.0)
MCHC: 27.8 g/dL — ABNORMAL LOW (ref 31.5–35.7)
MCV: 68 fL — ABNORMAL LOW (ref 79–97)
Monocytes Absolute: 0.5 10*3/uL (ref 0.1–0.9)
Monocytes: 6 %
Neutrophils Absolute: 7 10*3/uL (ref 1.4–7.0)
Neutrophils: 73 %
Platelets: 365 10*3/uL (ref 150–450)
RBC: 4.9 x10E6/uL (ref 3.77–5.28)
RDW: 17.5 % — ABNORMAL HIGH (ref 11.7–15.4)
WBC: 9.4 10*3/uL (ref 3.4–10.8)

## 2023-01-16 LAB — COMPREHENSIVE METABOLIC PANEL
ALT: 45 IU/L — ABNORMAL HIGH (ref 0–32)
AST: 39 IU/L (ref 0–40)
Albumin: 4.7 g/dL (ref 4.0–5.0)
Alkaline Phosphatase: 96 IU/L (ref 44–121)
BUN/Creatinine Ratio: 26 — ABNORMAL HIGH (ref 9–23)
BUN: 19 mg/dL (ref 6–20)
Bilirubin Total: 0.4 mg/dL (ref 0.0–1.2)
CO2: 19 mmol/L — ABNORMAL LOW (ref 20–29)
Calcium: 10.2 mg/dL (ref 8.7–10.2)
Chloride: 106 mmol/L (ref 96–106)
Creatinine, Ser: 0.72 mg/dL (ref 0.57–1.00)
Globulin, Total: 3.3 g/dL (ref 1.5–4.5)
Glucose: 100 mg/dL — ABNORMAL HIGH (ref 70–99)
Potassium: 5.1 mmol/L (ref 3.5–5.2)
Sodium: 141 mmol/L (ref 134–144)
Total Protein: 8 g/dL (ref 6.0–8.5)
eGFR: 118 mL/min/{1.73_m2} (ref >59)

## 2023-01-16 LAB — TSH+T4F+T3FREE
Free T4: 1.11 ng/dL (ref 0.82–1.77)
T3, Free: 3.1 pg/mL (ref 2.0–4.4)
TSH: 1.95 u[IU]/mL (ref 0.450–4.500)

## 2023-01-16 LAB — HEMOGLOBIN A1C
Est. average glucose Bld gHb Est-mCnc: 120 mg/dL
Hgb A1c MFr Bld: 5.8 % — ABNORMAL HIGH (ref 4.8–5.6)

## 2023-01-16 LAB — PT AND PTT
INR: 0.9 (ref 0.9–1.2)
Prothrombin Time: 10.3 s (ref 9.1–12.0)
aPTT: 23 s — ABNORMAL LOW (ref 24–33)

## 2023-01-16 LAB — BETA HCG QUANT (REF LAB): hCG Quant: <1 m[IU]/mL

## 2023-01-17 ENCOUNTER — Encounter (HOSPITAL_BASED_OUTPATIENT_CLINIC_OR_DEPARTMENT_OTHER): Payer: Self-pay

## 2023-01-17 NOTE — Addendum Note (Signed)
Addended by: Alyson Reedy on: 01/17/2023 08:47 AM   Modules accepted: Orders

## 2023-01-26 ENCOUNTER — Other Ambulatory Visit (HOSPITAL_BASED_OUTPATIENT_CLINIC_OR_DEPARTMENT_OTHER): Payer: Self-pay

## 2023-01-26 DIAGNOSIS — D509 Iron deficiency anemia, unspecified: Secondary | ICD-10-CM

## 2023-01-29 ENCOUNTER — Encounter (HOSPITAL_BASED_OUTPATIENT_CLINIC_OR_DEPARTMENT_OTHER): Payer: Self-pay | Admitting: Family Medicine

## 2023-01-29 ENCOUNTER — Other Ambulatory Visit (HOSPITAL_BASED_OUTPATIENT_CLINIC_OR_DEPARTMENT_OTHER): Payer: Self-pay

## 2023-01-29 ENCOUNTER — Ambulatory Visit (INDEPENDENT_AMBULATORY_CARE_PROVIDER_SITE_OTHER): Payer: Self-pay | Admitting: Family Medicine

## 2023-01-29 ENCOUNTER — Ambulatory Visit (HOSPITAL_BASED_OUTPATIENT_CLINIC_OR_DEPARTMENT_OTHER): Payer: Self-pay | Admitting: Family Medicine

## 2023-01-29 VITALS — BP 115/82 | HR 61 | Ht 59.0 in | Wt 125.3 lb

## 2023-01-29 DIAGNOSIS — N3000 Acute cystitis without hematuria: Secondary | ICD-10-CM | POA: Insufficient documentation

## 2023-01-29 DIAGNOSIS — R7303 Prediabetes: Secondary | ICD-10-CM | POA: Insufficient documentation

## 2023-01-29 DIAGNOSIS — D509 Iron deficiency anemia, unspecified: Secondary | ICD-10-CM | POA: Insufficient documentation

## 2023-01-29 DIAGNOSIS — Z01818 Encounter for other preprocedural examination: Secondary | ICD-10-CM

## 2023-01-29 DIAGNOSIS — R1310 Dysphagia, unspecified: Secondary | ICD-10-CM | POA: Insufficient documentation

## 2023-01-29 DIAGNOSIS — J029 Acute pharyngitis, unspecified: Secondary | ICD-10-CM

## 2023-01-29 MED ORDER — SULFAMETHOXAZOLE-TRIMETHOPRIM 200-40 MG/5ML PO SUSP
150.0000 mg/m2/d | Freq: Two times a day (BID) | ORAL | 0 refills | Status: AC
Start: 2023-01-29 — End: 2023-02-03
  Filled 2023-01-29 (×2): qty 144, 5d supply, fill #0

## 2023-01-29 MED ORDER — SULFAMETHOXAZOLE-TRIMETHOPRIM 200-40 MG/5ML PO SUSP: 150.0000 mg/m2/d | Freq: Two times a day (BID) | ORAL | 0 refills | Status: DC

## 2023-01-29 NOTE — Progress Notes (Signed)
Established Patient Office Visit  Subjective   Patient ID: Candace Watkins, female    DOB: 26-Jul-1995  Age: 27 y.o. MRN: [id number]  Chief Complaint  Patient presents with   Urinary Tract Infection    Urine has odor to it   Candace Watkins is a 27 year-old female patient is presenting today for concerns regarding urinary symptoms and would also like to discuss her recent labs.   OTC iron- take three times daily   Throat- always sore, difficulty swallowing pills within the last 2 years  Sometimes issues swallowing food but does not avoid certain foods  Feels like pills get stuck in her throat   Review of Systems  Constitutional:  Negative for chills, fever and malaise/fatigue.  HENT:  Positive for sore throat. Negative for congestion and sinus pain.   Eyes:  Negative for blurred vision and double vision.  Respiratory:  Negative for cough and shortness of breath.   Cardiovascular:  Negative for chest pain, palpitations and leg swelling.  Gastrointestinal:  Negative for abdominal pain, nausea and vomiting.  Genitourinary:  Negative for flank pain, frequency, hematuria and urgency.  Musculoskeletal:  Negative for myalgias.  Neurological:  Negative for dizziness, weakness and headaches.  Psychiatric/Behavioral:  Negative for depression and suicidal ideas. The patient is not nervous/anxious and does not have insomnia.       Objective:     BP 115/82   Pulse 61   Ht 4\' 11"  (1.499 m)   Wt 125 lb 4.8 oz (56.8 kg)   LMP 12/20/2022 (Approximate)   SpO2 100%   BMI 25.31 kg/m  BP Readings from Last 3 Encounters:  01/29/23 115/82  01/15/23 107/75  09/21/21 117/84     Physical Exam Constitutional:      Appearance: Normal appearance.  HENT:     Mouth/Throat:     Mouth: Mucous membranes are moist.     Pharynx: Oropharynx is clear. Posterior oropharyngeal erythema present. No pharyngeal swelling, oropharyngeal exudate or uvula swelling.     Tonsils: No  tonsillar exudate or tonsillar abscesses. 2+ on the right. 2+ on the left.  Cardiovascular:     Rate and Rhythm: Normal rate and regular rhythm.     Pulses: Normal pulses.     Heart sounds: Normal heart sounds.  Pulmonary:     Effort: Pulmonary effort is normal.     Breath sounds: Normal breath sounds.  Neurological:     Mental Status: She is alert.  Psychiatric:        Mood and Affect: Mood normal.        Behavior: Behavior normal.       Assessment & Plan:   1. Iron deficiency anemia, unspecified iron deficiency anemia type Low hemoglobin and hematocrit noted on recent labs. Patient has been taking OTC liquid iron supplement and would like her CBC rechecked today prior to her surgery. Discussed that it could take up to 3 months to notice an improvement in her CBC results. Will update patient with results.  - CBC with Differential/Platelet  2. Pre-operative clearance Patient needs HIV screening completed prior to scheduled elective surgery on 02/02/2023.  - HIV Antibody (routine testing w rflx)  3. Acute cystitis without hematuria Patient needed urinalysis completed for pre-operative clearance for her elective surgery, indicating possible urinary tract infection. Patient would like to be treated for this with antibiotic but is unable to provide urine sample today. Bactrim sent to pharmacy.   4. Pre-diabetes Patient wanted to discuss her  recent hemoglobin A1c results. Patient does report family history of diabetes. Encouraged lifestyle modifications to prevent development of diabetes, including healthy food choices and daily exercise.  - Hemoglobin A1c; Future  5. Sore throat Patient reports she has a sore throat daily and is unsure how long this has been present. Denies sinus congestion, postnasal drip, headache, fever/chills, body aches. Slight erythema with enlarged tonsils 2+ present. Will complete strep culture today and update patient with results.  - Culture, Group A  Strep  6. Dysphagia, unspecified type Patient reports she has had a difficult time swallowing pills for the last couple of years. Reports she does not have a difficult time eating certain foods and does not only eat soft foods. Discussed possible referral to GI for EGD to determine if any narrowing of esophagus.    Return in about 3 months (around 05/01/2023) for pre-diabetes f/u.    Candace Reedy, FNP

## 2023-02-12 LAB — HEMOGLOBIN A1C
Est. average glucose Bld gHb Est-mCnc: 114 mg/dL
Hgb A1c MFr Bld: 5.6 % (ref 4.8–5.6)

## 2023-02-12 LAB — CBC WITH DIFFERENTIAL/PLATELET
EOS (ABSOLUTE): 0.1 10*3/uL (ref 0.0–0.4)
MCHC: 29.1 g/dL — ABNORMAL LOW (ref 31.5–35.7)

## 2023-02-13 ENCOUNTER — Encounter (HOSPITAL_BASED_OUTPATIENT_CLINIC_OR_DEPARTMENT_OTHER): Payer: Self-pay | Admitting: Family Medicine

## 2023-02-21 ENCOUNTER — Other Ambulatory Visit (HOSPITAL_BASED_OUTPATIENT_CLINIC_OR_DEPARTMENT_OTHER): Payer: Self-pay

## 2023-02-21 DIAGNOSIS — Z01818 Encounter for other preprocedural examination: Secondary | ICD-10-CM

## 2023-02-21 DIAGNOSIS — D509 Iron deficiency anemia, unspecified: Secondary | ICD-10-CM

## 2023-02-22 ENCOUNTER — Other Ambulatory Visit (HOSPITAL_BASED_OUTPATIENT_CLINIC_OR_DEPARTMENT_OTHER): Payer: Self-pay

## 2023-02-23 ENCOUNTER — Telehealth (HOSPITAL_BASED_OUTPATIENT_CLINIC_OR_DEPARTMENT_OTHER): Payer: Self-pay | Admitting: Family Medicine

## 2023-02-23 LAB — CBC WITH DIFFERENTIAL/PLATELET
Basophils Absolute: 0.1 10*3/uL (ref 0.0–0.2)
Basos: 1 %
EOS (ABSOLUTE): 0 10*3/uL (ref 0.0–0.4)
Eos: 1 %
Hematocrit: 38.5 % (ref 34.0–46.6)
Hemoglobin: 11.3 g/dL (ref 11.1–15.9)
Immature Grans (Abs): 0 10*3/uL (ref 0.0–0.1)
Immature Granulocytes: 0 %
Lymphocytes Absolute: 1.9 10*3/uL (ref 0.7–3.1)
Lymphs: 30 %
MCH: 21.6 pg — ABNORMAL LOW (ref 26.6–33.0)
MCHC: 29.4 g/dL — ABNORMAL LOW (ref 31.5–35.7)
MCV: 74 fL — ABNORMAL LOW (ref 79–97)
Monocytes Absolute: 0.4 10*3/uL (ref 0.1–0.9)
Monocytes: 5 %
Neutrophils Absolute: 4.1 10*3/uL (ref 1.4–7.0)
Neutrophils: 63 %
Platelets: 287 10*3/uL (ref 150–450)
RBC: 5.23 x10E6/uL (ref 3.77–5.28)
RDW: 24.8 % — ABNORMAL HIGH (ref 11.7–15.4)
WBC: 6.4 10*3/uL (ref 3.4–10.8)

## 2023-02-23 NOTE — Telephone Encounter (Signed)
Routing message to [facility]

## 2023-02-23 NOTE — Telephone Encounter (Signed)
Patient having cosmetic surgery and needs the following labs and EKG.   She is leaving on 02/27/23 for sx on 02/28/23. I advised the patient Foye Clock is out of office and will not be able to see the patient before she leaves. Please advise patients on getting the labs. Sx clearance.  Thyroid panel Metabolic panel PT INR APTT HCG QUAT HEMAGLOBIN URINE ANALYSIS

## 2023-03-14 ENCOUNTER — Ambulatory Visit (INDEPENDENT_AMBULATORY_CARE_PROVIDER_SITE_OTHER): Payer: Self-pay | Admitting: Family Medicine

## 2023-03-14 ENCOUNTER — Encounter (HOSPITAL_BASED_OUTPATIENT_CLINIC_OR_DEPARTMENT_OTHER): Payer: Self-pay | Admitting: Family Medicine

## 2023-03-14 VITALS — BP 113/76 | HR 80 | Ht 59.0 in | Wt 126.0 lb

## 2023-03-14 DIAGNOSIS — R5383 Other fatigue: Secondary | ICD-10-CM

## 2023-03-14 NOTE — Progress Notes (Signed)
Established Patient Office Visit  Subjective   Patient ID: Candace Watkins, female    DOB: 27-Dec-1995  Age: 27 y.o. MRN: [id number]  Chief Complaint  Patient presents with   Fatigue   Candace Watkins is a 27 year-old female patient who presents today for fatigue. Reports that it started about 3 days ago. Sleeping well at night, feeling more sleepy during the day. Reports she gets super cold/super hot. BBL surgery performed on 02/28/2023.   Feels like her normal activity is but feels like she is not healing as quickly as other women who have had the surgery She was supposed to see hematology in the past but never did  She reports receiving an iron infusion after surgery   Review of Systems  Constitutional:  Positive for chills and malaise/fatigue. Negative for fever and weight loss.  Eyes:  Negative for blurred vision and double vision.  Respiratory:  Negative for cough.   Cardiovascular:  Negative for chest pain, palpitations and leg swelling.  Gastrointestinal:  Negative for abdominal pain, nausea and vomiting.  Musculoskeletal:  Negative for myalgias.  Neurological:  Negative for dizziness, weakness and headaches.  Psychiatric/Behavioral:  Negative for depression. The patient does not have insomnia.     Objective:    BP 113/76   Pulse 80   Ht 4\' 11"  (1.499 m)   Wt 126 lb (57.2 kg)   BMI 25.45 kg/m  BP Readings from Last 3 Encounters:  03/14/23 113/76  01/29/23 115/82  01/15/23 107/75    Physical Exam Vitals reviewed.  Constitutional:      Appearance: Normal appearance.  Cardiovascular:     Rate and Rhythm: Normal rate and regular rhythm.     Pulses: Normal pulses.     Heart sounds: Normal heart sounds.  Pulmonary:     Effort: Pulmonary effort is normal.     Breath sounds: Normal breath sounds.  Neurological:     Mental Status: She is alert.  Psychiatric:        Mood and Affect: Mood normal.        Behavior: Behavior normal.        Thought  Content: Thought content normal.        Judgment: Judgment normal.       Assessment & Plan:  Fatigue, unspecified type Assessment & Plan: Patient presents today for complaints of fatigue for the past few days. She recently underwent BBL elective surgery on 02/28/2023. She reports her post-operative recovery was complicated by low hemoglobin/hematocrit and received an iron transfusion. Plan to assess CBC and iron studies today. Will treat accordingly and place hematology referral if indicated.   Orders: -     CBC with Differential/Platelet -     Iron, TIBC and Ferritin Panel     Return if symptoms worsen or fail to improve.    Alyson Reedy, FNP

## 2023-03-15 LAB — CBC WITH DIFFERENTIAL/PLATELET
Basophils Absolute: 0.1 10*3/uL (ref 0.0–0.2)
Basos: 1 %
EOS (ABSOLUTE): 0.1 10*3/uL (ref 0.0–0.4)
Eos: 2 %
Hematocrit: 33 % — ABNORMAL LOW (ref 34.0–46.6)
Hemoglobin: 9.6 g/dL — ABNORMAL LOW (ref 11.1–15.9)
Immature Grans (Abs): 0 10*3/uL (ref 0.0–0.1)
Immature Granulocytes: 0 %
Lymphocytes Absolute: 2.4 10*3/uL (ref 0.7–3.1)
Lymphs: 27 %
MCH: 23.9 pg — ABNORMAL LOW (ref 26.6–33.0)
MCHC: 29.1 g/dL — ABNORMAL LOW (ref 31.5–35.7)
MCV: 82 fL (ref 79–97)
Monocytes Absolute: 0.6 10*3/uL (ref 0.1–0.9)
Monocytes: 6 %
Neutrophils Absolute: 5.6 10*3/uL (ref 1.4–7.0)
Neutrophils: 64 %
Platelets: 618 10*3/uL — ABNORMAL HIGH (ref 150–450)
RBC: 4.02 x10E6/uL (ref 3.77–5.28)
RDW: 24.8 % — ABNORMAL HIGH (ref 11.7–15.4)
WBC: 8.8 10*3/uL (ref 3.4–10.8)

## 2023-03-15 LAB — IRON,TIBC AND FERRITIN PANEL
Ferritin: 147 ng/mL (ref 15–150)
Iron Saturation: 5 % — CL (ref 15–55)
Iron: 17 ug/dL — ABNORMAL LOW (ref 27–159)
Total Iron Binding Capacity: 351 ug/dL (ref 250–450)
UIBC: 334 ug/dL (ref 131–425)

## 2023-03-16 ENCOUNTER — Other Ambulatory Visit (HOSPITAL_BASED_OUTPATIENT_CLINIC_OR_DEPARTMENT_OTHER): Payer: Self-pay | Admitting: Family Medicine

## 2023-03-16 DIAGNOSIS — R5383 Other fatigue: Secondary | ICD-10-CM | POA: Insufficient documentation

## 2023-03-16 DIAGNOSIS — D509 Iron deficiency anemia, unspecified: Secondary | ICD-10-CM

## 2023-03-16 NOTE — Assessment & Plan Note (Addendum)
Patient presents today for complaints of fatigue for the past few days. She recently underwent BBL elective surgery on 02/28/2023. She reports her post-operative recovery was complicated by low hemoglobin/hematocrit and received an iron transfusion. Plan to assess CBC and iron studies today. Will treat accordingly and place hematology referral if indicated.

## 2023-03-19 MED ORDER — IRON (FERROUS SULFATE) 325 (65 FE) MG PO TABS: 325.0000 mg | ORAL_TABLET | Freq: Every day | ORAL | 2 refills | Status: DC

## 2023-04-09 ENCOUNTER — Encounter (HOSPITAL_BASED_OUTPATIENT_CLINIC_OR_DEPARTMENT_OTHER): Payer: Self-pay | Admitting: Family Medicine

## 2023-04-19 ENCOUNTER — Inpatient Hospital Stay: Payer: No Typology Code available for payment source | Admitting: Medical Oncology

## 2023-04-19 ENCOUNTER — Inpatient Hospital Stay: Payer: No Typology Code available for payment source | Attending: Medical Oncology

## 2023-05-03 ENCOUNTER — Ambulatory Visit (HOSPITAL_BASED_OUTPATIENT_CLINIC_OR_DEPARTMENT_OTHER): Payer: Self-pay | Admitting: Family Medicine

## 2023-05-17 ENCOUNTER — Encounter (HOSPITAL_BASED_OUTPATIENT_CLINIC_OR_DEPARTMENT_OTHER): Payer: Self-pay | Admitting: Family Medicine

## 2023-05-22 ENCOUNTER — Encounter (HOSPITAL_BASED_OUTPATIENT_CLINIC_OR_DEPARTMENT_OTHER): Payer: Self-pay | Admitting: Family Medicine

## 2023-06-13 ENCOUNTER — Ambulatory Visit (INDEPENDENT_AMBULATORY_CARE_PROVIDER_SITE_OTHER): Payer: Self-pay | Admitting: Family Medicine

## 2023-06-13 ENCOUNTER — Other Ambulatory Visit (HOSPITAL_BASED_OUTPATIENT_CLINIC_OR_DEPARTMENT_OTHER): Payer: Self-pay

## 2023-06-13 VITALS — BP 117/70 | HR 61 | Ht 59.0 in | Wt 124.0 lb

## 2023-06-13 DIAGNOSIS — R300 Dysuria: Secondary | ICD-10-CM

## 2023-06-13 DIAGNOSIS — Z3201 Encounter for pregnancy test, result positive: Secondary | ICD-10-CM

## 2023-06-13 DIAGNOSIS — N926 Irregular menstruation, unspecified: Secondary | ICD-10-CM

## 2023-06-13 DIAGNOSIS — D508 Other iron deficiency anemias: Secondary | ICD-10-CM

## 2023-06-13 LAB — POCT URINALYSIS DIP (CLINITEK)
Glucose, UA: NEGATIVE mg/dL
Ketones, POC UA: NEGATIVE mg/dL
Nitrite, UA: POSITIVE — AB
POC PROTEIN,UA: 30 — AB
Spec Grav, UA: >=1.03 — AB (ref 1.010–1.025)
Urobilinogen, UA: 1 U/dL
pH, UA: 5.5 (ref 5.0–8.0)

## 2023-06-13 LAB — POCT URINE PREGNANCY: Preg Test, Ur: POSITIVE — AB

## 2023-06-13 MED ORDER — MICONAZOLE NITRATE 100 MG VA SUPP
100.0000 mg | Freq: Every day | VAGINAL | 0 refills | Status: DC
  Filled 2023-06-13: qty 7, 7d supply, fill #0

## 2023-06-13 MED ORDER — AMOXICILLIN 400 MG/5ML PO SUSR
500.0000 mg | Freq: Two times a day (BID) | ORAL | 0 refills | Status: DC
  Filled 2023-06-13: qty 28, 7d supply, fill #0
  Filled 2023-06-13: qty 100, 7d supply, fill #0

## 2023-06-13 NOTE — Progress Notes (Unsigned)
   Complete physical exam  Patient: Candace Watkins   DOB: 1996-01-19   27 y.o. Female  MRN: [id number]  Subjective:    Chief Complaint  Patient presents with   Contraception   Urinary Tract Infection    Burning with urination, started about a month ago   Annual Exam   Nipples are sore  Fatigue  LMP 11/10   Depression screenings:    01/15/2023    9:08 AM 05/03/2018   10:41 AM 04/10/2014    6:07 PM  Depression screen PHQ 2/9  Decreased Interest 0 1 1  Down, Depressed, Hopeless 0 0 1  PHQ - 2 Score 0 1 2  Altered sleeping  2 0  Tired, decreased energy  3 3  Change in appetite  3 3  Feeling bad or failure about yourself   0 2  Trouble concentrating  1 0  Moving slowly or fidgety/restless  1 2  Suicidal thoughts  0 0  PHQ-9 Score  11 12    Anxiety screenings:    01/15/2023    9:08 AM 05/03/2018   10:41 AM  GAD 7 : Generalized Anxiety Score  Nervous, Anxious, on Edge 0 1  Control/stop worrying 0 1  Worry too much - different things 1 0  Trouble relaxing 0 1  Restless 0 0  Easily annoyed or irritable 0 1  Afraid - awful might happen 0 0  Total GAD 7 Score 1 4  Anxiety Difficulty Not difficult at all      {VISON DENTAL STD PSA (Optional):27386}  Patient Care Team: Alyson Reedy, FNP as PCP - General (Family Medicine)   Outpatient Medications Prior to Visit  Medication Sig   [DISCONTINUED] Bioflavonoid Products (VITAMIN C) CHEW Chew by mouth. Takes 2 gummies daily (Patient not taking: Reported on 06/13/2023)   [DISCONTINUED] FOLIC ACID PO Take by mouth. Daily (Patient not taking: Reported on 06/13/2023)   [DISCONTINUED] Iron, Ferrous Sulfate, 325 (65 Fe) MG TABS Take 325 mg by mouth daily. (Patient not taking: Reported on 06/13/2023)   No facility-administered medications prior to visit.    ROS     Objective:     BP 117/70   Pulse 61   Ht 4\' 11"  (1.499 m)   Wt 124 lb (56.2 kg)   LMP 05/06/2023 (Exact Date)   BMI 25.04 kg/m  BP  Readings from Last 3 Encounters:  06/13/23 117/70  03/14/23 113/76  01/29/23 115/82     Physical Exam      Assessment & Plan:    Routine Health Maintenance and Physical Exam  Health Maintenance  Topic Date Due   HPV Vaccine (1 - 3-dose series) Never done   Hepatitis C Screening  Never done   COVID-19 Vaccine (1 - 2024-25 season) 06/29/2023*   Flu Shot  09/24/2023*   DTaP/Tdap/Td vaccine (3 - Td or Tdap) 07/25/2023   Pap Smear  10/24/2025   HIV Screening  Completed  *Topic was postponed. The date shown is not the original due date.       No follow-ups on file.     Alyson Reedy, FNP

## 2023-06-14 ENCOUNTER — Encounter (HOSPITAL_BASED_OUTPATIENT_CLINIC_OR_DEPARTMENT_OTHER): Payer: Self-pay | Admitting: Family Medicine

## 2023-06-14 LAB — CBC WITH DIFFERENTIAL/PLATELET
Basophils Absolute: 0.1 10*3/uL (ref 0.0–0.2)
Basos: 1 %
EOS (ABSOLUTE): 0 10*3/uL (ref 0.0–0.4)
Eos: 0 %
Hematocrit: 35.9 % (ref 34.0–46.6)
Hemoglobin: 10.5 g/dL — ABNORMAL LOW (ref 11.1–15.9)
Immature Grans (Abs): 0 10*3/uL (ref 0.0–0.1)
Immature Granulocytes: 0 %
Lymphocytes Absolute: 2.7 10*3/uL (ref 0.7–3.1)
Lymphs: 32 %
MCH: 21.9 pg — ABNORMAL LOW (ref 26.6–33.0)
MCHC: 29.2 g/dL — ABNORMAL LOW (ref 31.5–35.7)
MCV: 75 fL — ABNORMAL LOW (ref 79–97)
Monocytes Absolute: 0.6 10*3/uL (ref 0.1–0.9)
Monocytes: 7 %
Neutrophils Absolute: 5 10*3/uL (ref 1.4–7.0)
Neutrophils: 60 %
Platelets: 412 10*3/uL (ref 150–450)
RBC: 4.79 x10E6/uL (ref 3.77–5.28)
RDW: 17.5 % — ABNORMAL HIGH (ref 11.7–15.4)
WBC: 8.4 10*3/uL (ref 3.4–10.8)

## 2023-06-14 LAB — BETA HCG QUANT (REF LAB): hCG Quant: 2374 m[IU]/mL

## 2023-06-15 LAB — CT, NG, MYCOPLASMAS NAA, URINE
Chlamydia trachomatis, NAA: NEGATIVE
Mycoplasma genitalium NAA: NEGATIVE
Mycoplasma hominis NAA: POSITIVE — AB
Neisseria gonorrhoeae, NAA: NEGATIVE
Ureaplasma spp NAA: POSITIVE — AB

## 2023-06-18 ENCOUNTER — Telehealth (HOSPITAL_BASED_OUTPATIENT_CLINIC_OR_DEPARTMENT_OTHER): Payer: Self-pay | Admitting: *Deleted

## 2023-06-18 ENCOUNTER — Other Ambulatory Visit (HOSPITAL_BASED_OUTPATIENT_CLINIC_OR_DEPARTMENT_OTHER): Payer: Self-pay | Admitting: Family Medicine

## 2023-06-18 LAB — URINE CULTURE

## 2023-06-18 MED ORDER — CEFIXIME 100 MG/5ML PO SUSR: 400.0000 mg | Freq: Every day | ORAL | 0 refills | Status: DC

## 2023-06-18 NOTE — Telephone Encounter (Signed)
Copied from CRM [id number]. Topic: Clinical - Prescription Issue >> Jun 18, 2023 12:06 PM Maxwell Marion wrote: Reason for CRM: Hui from [facility] called in regards to prescription medication, call back number to pharmacy is [phone]      [facility] about pt's medication. Per pharmacist, only was sent in which would only be enough for 2.5 days. Pharmacist wanted to know how long pt was to be on the medication and if longer than 2.5 days, quantity would need to be increased.  Foye Clock, please advise.

## 2023-06-21 NOTE — Telephone Encounter (Signed)
Called pt's pharmacy and spoke with pharmacy tech and provided her a verbal per Foye Clock to change the quantity so pt has enough for 3 days of the medication. Understanding was verbalized. Nothing further needed.

## 2023-09-24 ENCOUNTER — Encounter: Payer: Self-pay | Admitting: Family Medicine

## 2023-09-25 ENCOUNTER — Ambulatory Visit (INDEPENDENT_AMBULATORY_CARE_PROVIDER_SITE_OTHER): Payer: Self-pay | Admitting: Family Medicine

## 2023-09-25 ENCOUNTER — Encounter: Payer: Self-pay | Admitting: Family Medicine

## 2023-09-25 VITALS — BP 114/76 | HR 61 | Temp 98.1°F | Ht 59.45 in | Wt 117.8 lb

## 2023-09-25 DIAGNOSIS — Z30011 Encounter for initial prescription of contraceptive pills: Secondary | ICD-10-CM

## 2023-09-25 DIAGNOSIS — Z3042 Encounter for surveillance of injectable contraceptive: Secondary | ICD-10-CM

## 2023-09-25 LAB — POCT URINE PREGNANCY: Preg Test, Ur: NEGATIVE

## 2023-09-25 LAB — POCT URINALYSIS DIP (CLINITEK)
Bilirubin, UA: NEGATIVE
Glucose, UA: NEGATIVE mg/dL
Ketones, POC UA: NEGATIVE mg/dL
Leukocytes, UA: NEGATIVE
Nitrite, UA: NEGATIVE
POC PROTEIN,UA: NEGATIVE
Spec Grav, UA: 1.025 (ref 1.010–1.025)
Urobilinogen, UA: 0.2 U/dL
pH, UA: 5.5 (ref 5.0–8.0)

## 2023-09-25 MED ORDER — MEDROXYPROGESTERONE ACETATE 150 MG/ML IM SUSP
150.0000 mg | Freq: Once | INTRAMUSCULAR | Status: AC
  Administered 2023-09-25: 150 mg via INTRAMUSCULAR

## 2023-09-25 NOTE — Progress Notes (Signed)
 Subjective:   Candace Watkins [birth year]  09/25/2023   CC: Chief Complaint  Patient presents with   Contraception    Pt. Requesting birth control.     HPI: Candace Watkins is a 28 y.o. female who presents for a routine health maintenance exam.  Labs collected at previous visits.   Had Nexplanon LMP: currently on, started Saturday 3/29  HEALTH SCREENINGS: - Vision Screening:  about 2 years ago, blurred vision during the afternoon reports it happens more when she is tired - Dental Visits: up to date - Pap smear: up to date - Breast Exam:  declined - STD Screening:  declined - Mammogram (40+): Not applicable  - Colonoscopy (45+): Not applicable  - Bone Density (65+ or under 65 with predisposing conditions): Not applicable  - Lung CA screening with low-dose CT:  Not applicable Adults age 77-80 who are current cigarette smokers or quit within the last 15 years. Must have 20 pack year history.   Depression and Anxiety Screen done today and results listed below:     09/25/2023   11:06 AM 01/15/2023    9:08 AM 05/03/2018   10:41 AM 04/10/2014    6:07 PM  Depression screen PHQ 2/9  Decreased Interest 0 0 1 1  Down, Depressed, Hopeless 0 0 0 1  PHQ - 2 Score 0 0 1 2  Altered sleeping 0  2 0  Tired, decreased energy 0  3 3  Change in appetite 0  3 3  Feeling bad or failure about yourself  0  0 2  Trouble concentrating 0  1 0  Moving slowly or fidgety/restless 0  1 2  Suicidal thoughts 0  0 0  PHQ-9 Score 0  11 12  Difficult doing work/chores Not difficult at all         09/25/2023   11:07 AM 01/15/2023    9:08 AM 05/03/2018   10:41 AM  GAD 7 : Generalized Anxiety Score  Nervous, Anxious, on Edge 1 0 1  Control/stop worrying 0 0 1  Worry too much - different things 0 1 0  Trouble relaxing 0 0 1  Restless 0 0 0  Easily annoyed or irritable 0 0 1  Afraid - awful might happen 0 0 0  Total GAD 7 Score 1 1 4   Anxiety Difficulty Not difficult at all Not  difficult at all     IMMUNIZATIONS: - Tdap: Tetanus vaccination status reviewed: declined. - HPV: Refused - Influenza: Postponed to flu season - Pneumovax: Not applicable - Prevnar 20: Not applicable - Zostavax (50+): Not applicable  Past medical history, surgical history, medications, allergies, family history and social history reviewed with patient today and changes made to appropriate areas of the chart.   Past Medical History:  Diagnosis Date   Anemia 07/05/2013   Started on iron 1/10     Presence of subdermal contraceptive device 11/13/2013   Rubella non-immune status, antepartum 07/09/2013    Past Surgical History:  Procedure Laterality Date   NO PAST SURGERIES      No current outpatient medications on file prior to visit.   No current facility-administered medications on file prior to visit.    No Known Allergies   Social History   Socioeconomic History   Marital status: Single    Spouse name: Not on file   Number of children: Not on file   Years of education: Not on file   Highest education level: 9th grade  Occupational History  Not on file  Tobacco Use   Smoking status: Never   Smokeless tobacco: Never  Substance and Sexual Activity   Alcohol use: Yes    Comment: occas. 1/month   Drug use: No   Sexual activity: Yes    Birth control/protection: None    Comment: Last intercourse 04/06/2013  Other Topics Concern   Not on file  Social History Narrative   Not on file   Social Drivers of Health   Financial Resource Strain: Low Risk  (06/13/2023)   Overall Financial Resource Strain (CARDIA)    Difficulty of Paying Living Expenses: Not very hard  Food Insecurity: No Food Insecurity (06/13/2023)   Hunger Vital Sign    Worried About Running Out of Food in the Last Year: Never true    Ran Out of Food in the Last Year: Never true  Transportation Needs: No Transportation Needs (06/13/2023)   PRAPARE - Administrator, Civil Service  (Medical): No    Lack of Transportation (Non-Medical): No  Physical Activity: Unknown (06/13/2023)   Exercise Vital Sign    Days of Exercise per Week: Patient declined    Minutes of Exercise per Session: Not on file  Stress: Stress Concern Present (06/13/2023)   [employer] of Occupational Health - Occupational Stress Questionnaire    Feeling of Stress : To some extent  Social Connections: Moderately Isolated (06/13/2023)   Social Connection and Isolation Panel [NHANES]    Frequency of Communication with Friends and Family: Three times a week    Frequency of Social Gatherings with Friends and Family: Three times a week    Attends Religious Services: More than 4 times per year    Active Member of Clubs or Organizations: No    Attends Engineer, structural: Not on file    Marital Status: Never married  Intimate Partner Violence: Not on file   Social History   Tobacco Use  Smoking Status Never  Smokeless Tobacco Never   Social History   Substance and Sexual Activity  Alcohol Use Yes   Comment: occas. 1/month    Family History  Problem Relation Age of Onset   Diabetes Mother    Hypertension Mother    Hyperlipidemia Father    Diabetes Paternal Grandmother      ROS: Denies fever, fatigue, unexplained weight loss/gain, chest pain, SHOB, and palpitations. Denies neurological deficits, gastrointestinal or genitourinary complaints, and skin changes.   Objective:   Today's Vitals   09/25/23 1103  BP: 114/76  Pulse: 61  Temp: 98.1 F (36.7 C)  TempSrc: Oral  SpO2: 98%  Weight: 117 lb 12.8 oz (53.4 kg)  Height: 4' 11.45" (1.51 m)  PainSc: 0-No pain    GENERAL APPEARANCE: Well-appearing, in NAD. Well nourished.  SKIN: Pink, warm and dry. Turgor normal. No rash, lesion, ulceration, or ecchymoses. Hair evenly distributed.  HEENT: HEAD: Normocephalic.  EYES: PERRLA. EOMI. Lids intact w/o defect. Sclera white, Conjunctiva pink w/o exudate.  EARS: External  ear w/o redness, swelling, masses or lesions. EAC clear. TM's intact, translucent w/o bulging, appropriate landmarks visualized. Appropriate acuity to conversational tones.  NOSE: Septum midline w/o deformity. Nares patent, mucosa pink and non-inflamed w/o drainage. No sinus tenderness.  THROAT: Uvula midline. Oropharynx clear. Tonsils non-inflamed w/o exudate. Oral mucosa pink and moist.  NECK: Supple, Trachea midline. Full ROM w/o pain or tenderness. No lymphadenopathy. Thyroid non-tender w/o enlargement or palpable masses.  BREASTS: Deferred.  RESPIRATORY: Chest wall symmetrical w/o masses. Respirations even and non-labored.  Breath sounds clear to auscultation bilaterally. No wheezes, rales, rhonchi, or crackles. CARDIAC: S1, S2 present, regular rate and rhythm. No gallops, murmurs, rubs, or clicks. PMI w/o lifts, heaves, or thrills. No carotid bruits. Capillary refill <2 seconds. Peripheral pulses 2+ bilaterally. GI: Abdomen soft w/o distention. Normoactive bowel sounds. No palpable masses or tenderness. No guarding or rebound tenderness. Liver and spleen w/o tenderness or enlargement. No CVA tenderness.  GU: Deferred.  MSK: Muscle tone and strength appropriate for age, w/o atrophy or abnormal movement.  EXTREMITIES: Active ROM intact, w/o tenderness, crepitus, or contracture. No obvious joint deformities or effusions. No clubbing, edema, or cyanosis.  NEUROLOGIC: CN's II-XII intact. Motor strength symmetrical with no obvious weakness. No sensory deficits. DTR's 2+ symmetric bilaterally. Steady, even gait.  PSYCH/MENTAL STATUS: Alert, oriented x 3. Cooperative, appropriate mood and affect.   Results for orders placed or performed in visit on 09/25/23  POCT URINALYSIS DIP (CLINITEK)   Collection Time: 09/25/23 11:35 AM  Result Value Ref Range   Color, UA yellow yellow   Clarity, UA clear clear   Glucose, UA negative negative mg/dL   Bilirubin, UA negative negative   Ketones, POC UA negative  negative mg/dL   Spec Grav, UA 4.098 1.191 - 1.025   Blood, UA large (A) negative   pH, UA 5.5 5.0 - 8.0   POC PROTEIN,UA negative negative, trace   Urobilinogen, UA 0.2 0.2 or 1.0 E.U./dL   Nitrite, UA Negative Negative   Leukocytes, UA Negative Negative  POCT urine pregnancy   Collection Time: 09/25/23 11:35 AM  Result Value Ref Range   Preg Test, Ur Negative Negative    Assessment & Plan:   1. Encounter for initial prescription of contraceptive pills (Primary) - Encouraged a healthy well-balanced diet. Patient may adjust caloric intake to maintain or achieve ideal body weight. May reduce intake of dietary saturated fat and total fat and have adequate dietary potassium and calcium preferably from fresh fruits, vegetables, and low-fat dairy products.   - Advised to avoid cigarette smoking. - Discussed with the patient that most people either abstain from alcohol or drink within safe limits (<=14/week and <=4 drinks/occasion for males, <=7/weeks and <= 3 drinks/occasion for females) and that the risk for alcohol disorders and other health effects rises proportionally with the number of drinks per week and how often a drinker exceeds daily limits. - Discussed cessation/primary prevention of drug use and availability of treatment for abuse.  - Discussed sexually transmitted diseases, avoidance of unintended pregnancy and contraceptive alternatives. - Stressed the importance of regular exercise - Injury prevention: Discussed safety belts, safety helmets, smoke detector, smoking near bedding or upholstery.  - Dental health: Discussed importance of regular tooth brushing, flossing, and dental visits.  - POCT URINALYSIS DIP (CLINITEK) - POCT urine pregnancy  2. Encounter for management and injection of depo-Provera Discussed various forms of birth control- including IUDs, Depo injection, Nexplanon, and combined oral contraception. She would like to start Depo injection today. No  contraindications to start. POCT urine pregnancy test negative today. Counseled patient about receiving injections every 12 weeks, if she would like to continue with this form of birth control.  - medroxyPROGESTERone (DEPO-PROVERA) injection 150 mg - POCT URINALYSIS DIP (CLINITEK) - POCT urine pregnancy  Orders Placed This Encounter  Procedures   POCT URINALYSIS DIP (CLINITEK)   POCT urine pregnancy    NEXT PREVENTATIVE PHYSICAL DUE IN 1 YEAR.  Return in about 1 year (around 09/24/2024) for Physical with fasting labs.  Patient to reach out to office if new, worrisome, or unresolved symptoms arise or if no improvement in patient's condition. Patient verbalized understanding and is agreeable to treatment plan. All questions answered to patient's satisfaction.   Alyson Reedy, FNP

## 2023-09-25 NOTE — Patient Instructions (Signed)
 Health Maintenance Recommendations Screening Testing Mammogram Every 1 -2 years based on history and risk factors Starting at age 27 Pap Smear Ages 21-39 every 3 years Ages 68-65 every 5 years with HPV testing More frequent testing may be required based on results and history Colon Cancer Screening Every 1-10 years based on test performed, risk factors, and history Starting at age 47 Bone Density Screening Every 2-10 years based on history Starting at age 67 for women Recommendations for men differ based on medication usage, history, and risk factors AAA Screening One time ultrasound Men 84-53 years old who have every smoked Lung Cancer Screening Low Dose Lung CT every 12 months Age 35-80 years with a 30 pack-year smoking history who still smoke or who have quit within the last 15 years   Screening Labs Routine  Labs: Complete Blood Count (CBC), Complete Metabolic Panel (CMP), Cholesterol (Lipid Panel) Every 6-12 months based on history and medications May be recommended more frequently based on current conditions or previous results Hemoglobin A1c Lab Every 3-12 months based on history and previous results Starting at age 56 or earlier with diagnosis of diabetes, high cholesterol, BMI >26, and/or risk factors Frequent monitoring for patients with diabetes to ensure blood sugar control Thyroid Panel (TSH w/ T3 & T4) Every 6 months based on history, symptoms, and risk factors May be repeated more often if on medication HIV One time testing for all patients 63 and older May be repeated more frequently for patients with increased risk factors or exposure Hepatitis C One time testing for all patients 64 and older May be repeated more frequently for patients with increased risk factors or exposure Gonorrhea, Chlamydia Every 12 months for all sexually active persons 13-24 years Additional monitoring may be recommended for those who are considered high risk or who have  symptoms PSA Men 60-7 years old with risk factors Additional screening may be recommended from age 52-69 based on risk factors, symptoms, and history   Vaccine Recommendations Tetanus Booster All adults every 10 years Flu Vaccine All patients 6 months and older every year COVID Vaccine All patients 12 years and older Initial dosing with booster May recommend additional booster based on age and health history HPV Vaccine 2 doses all patients age 45-26 Dosing may be considered for patients over 26 Shingles Vaccine (Shingrix) 2 doses all adults 55 years and older Pneumonia (Pneumovax 23) All adults 65 years and older May recommend earlier dosing based on health history Pneumonia (Prevnar 10) All adults 65 years and older Dosed 1 year after Pneumovax 23   Additional Screening, Testing, and Vaccinations may be recommended on an individualized basis based on family history, health history, risk factors, and/or exposure.  __________________________________________________________   Diet Recommendations for All Patients   I recommend that all patients maintain a diet low in saturated fats, carbohydrates, and cholesterol. While this can be challenging at first, it is not impossible and small changes can make big differences.  Things to try: Decreasing the amount of soda, sweet tea, and/or juice to one or less per day and replace with water While water is always the first choice, if you do not like water you may consider adding a water additive without sugar to improve the taste other sugar free drinks Replace potatoes with a brightly colored vegetable at dinner Use healthy oils, such as canola oil or olive oil, instead of butter or hard margarine Limit your bread intake to two pieces or less a day Replace regular pasta with  low carb pasta options Bake, broil, or grill foods instead of frying Monitor portion sizes  Eat smaller, more frequent meals throughout the day instead of large  meals   An important thing to remember is, if you love foods that are not great for your health, you don't have to give them up completely. Instead, allow these foods to be a reward when you have done well. Allowing yourself to still have special treats every once in a while is a nice way to tell yourself thank you for working hard to keep yourself healthy.    Also remember that every day is a new day. If you have a bad day and "fall off the wagon", you can still climb right back up and keep moving along on your journey!   We have resources available to help you!  Some websites that may be helpful include: www.http://www.wall-moore.info/        Www.VeryWellFit.com _____________________________________________________________   Activity Recommendations for All Patients   I recommend that all adults get at least 20 minutes of moderate physical activity that elevates your heart rate at least 5 days out of the week.  Some examples include: Walking or jogging at a pace that allows you to carry on a conversation Cycling (stationary bike or outdoors) Water aerobics Yoga Weight lifting Dancing If physical limitations prevent you from putting stress on your joints, exercise in a pool or seated in a chair are excellent options.   Do determine your MAXIMUM heart rate for activity: YOUR AGE - 220 = MAX HeartRate    Remember! Do not push yourself too hard.  Start slowly and build up your pace, speed, weight, time in exercise, etc.  Allow your body to rest between exercise and get good sleep. You will need more water than normal when you are exerting yourself. Do not wait until you are thirsty to drink. Drink with a purpose of getting in at least 8, 8 ounce glasses of water a day plus more depending on how much you exercise and sweat.      If you begin to develop dizziness, chest pain, abdominal pain, jaw pain, shortness of breath, headache, vision changes, lightheadedness, or other concerning symptoms, stop the  activity and allow your body to rest. If your symptoms are severe, seek emergency evaluation immediately. If your symptoms are concerning, but not severe, please let us know so that we can recommend further evaluation.      MyChart:  For all urgent or time sensitive needs we ask that you please call the office to avoid delays. Our number is [phone]. MyChart is not constantly monitored and due to the large volume of messages a day, replies may take up to 72 business hours.   MyChart Policy: MyChart allows for you to see your visit notes, after visit summary, provider recommendations, lab and tests results, make an appointment, request refills, and contact your provider or the office for non-urgent questions or concerns. Providers are seeing patients during normal business hours and do not have built in time to review MyChart messages.  We ask that you allow a minimum of 3 business days for responses to [employer]. For this reason, please do not send urgent requests through MyChart. Please call the office at [phone]. New and ongoing conditions may require a visit. We have virtual and in person visit available for your convenience.  Complex MyChart concerns may require a visit. Your provider may request you schedule a virtual or in person visit  to ensure we are providing the best care possible. MyChart messages sent after 11:00 AM on Friday will not be received by the provider until Monday morning.    Lab and Test Results: You will receive your lab and test results on MyChart as soon as they are completed and results have been sent by the lab or testing facility. Due to this service, you will receive your results BEFORE your provider.  I review lab and tests results each morning prior to seeing patients. Some results require collaboration with other providers to ensure you are receiving the most appropriate care. For this reason, we ask that you please allow a minimum of 3-5 business  days from the time the ALL results have been received for your provider to receive and review lab and test results and contact you about these.  Most lab and test result comments from the provider will be sent through MyChart. Your provider may recommend changes to the plan of care, follow-up visits, repeat testing, ask questions, or request an office visit to discuss these results. You may reply directly to this message or call the office at [phone] to provide information for the provider or set up an appointment. In some instances, you will be called with test results and recommendations. Please let us know if this is preferred and we will make note of this in your chart to provide this for you.    If you have not heard a response to your lab or test results in 5 business days from all results returning to MyChart, please call the office to let us know. We ask that you please avoid calling prior to this time unless there is an emergent concern. Due to high call volumes, this can delay the resulting process.   After Hours: For all non-emergency after hours needs, please call the office at [phone] and select the option to reach the on-call provider service. On-call services are shared between multiple [facility] offices and therefore it will not be possible to speak directly with your provider. On-call providers may provide medical advice and recommendations, but are unable to provide refills for maintenance medications.  For all emergency or urgent medical needs after normal business hours, we recommend that you seek care at the closest Urgent Care or Emergency Department to ensure appropriate treatment in a timely manner.  [facility] has a 24 hour emergency room located on the ground floor for your convenience.    Urgent Concerns During the Business Day Providers are seeing patients from 8AM to 5PM, Monday through Thursday, and 8AM to 12PM on Friday with a busy  schedule and are most often not able to respond to non-urgent calls until the end of the day or the next business day. If you should have URGENT concerns during the day, please call and speak to the nurse or schedule a same day appointment so that we can address your concern without delay.    Thank you, again, for choosing me as your health care partner. I appreciate your trust and look forward to learning more about you.    Alyson Reedy, FNP-C

## 2023-11-10 IMAGING — CT CT CERVICAL SPINE W/O CM
3 of 4 series · 13 of 33 positions shown, 16 images · non-contrast
Comparison: None.

CLINICAL DATA: Trauma



[Series 6: c_spine 2.0 sag bone · sagittal · 0.29mm/px · 5 of 61 slices shown, 6 images]
[im 21/61  bone]
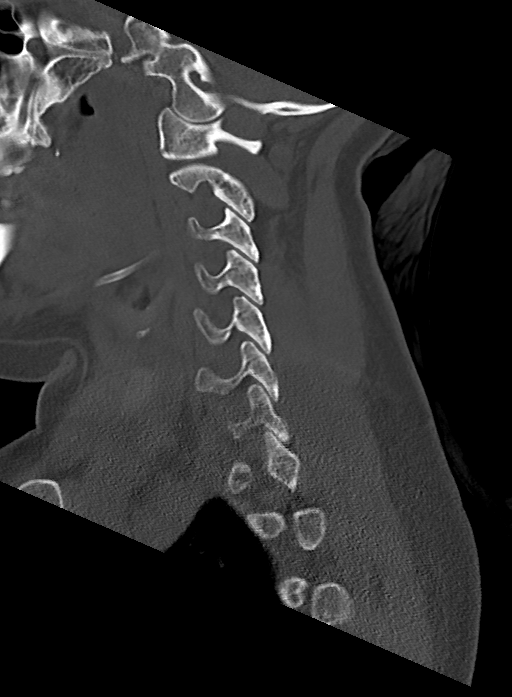
[im 26/61  bone]
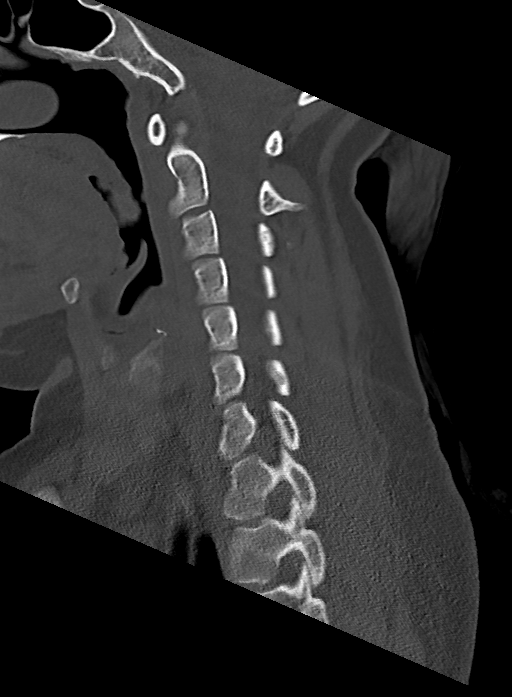
[im 31/61  soft-tissue]
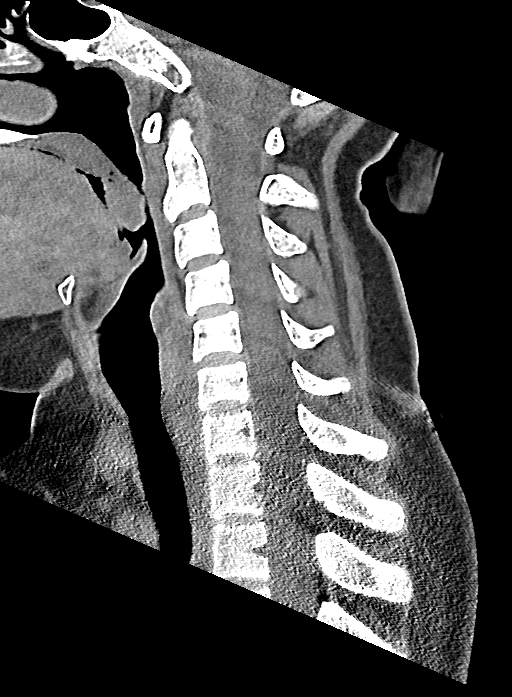
[im 31/61  bone]
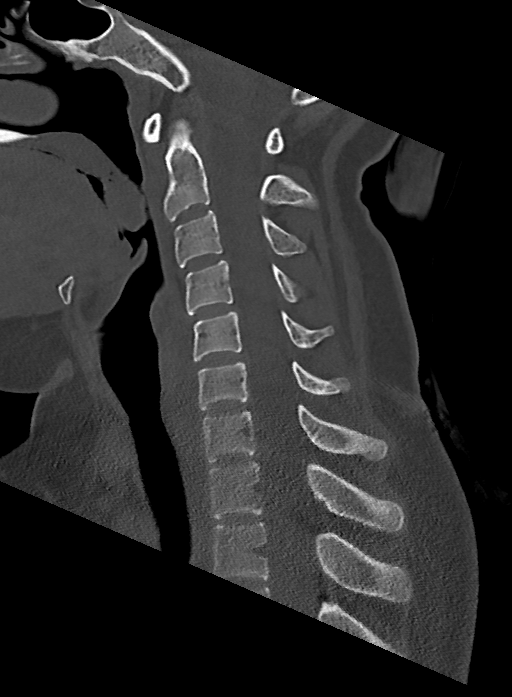
[im 36/61  bone]
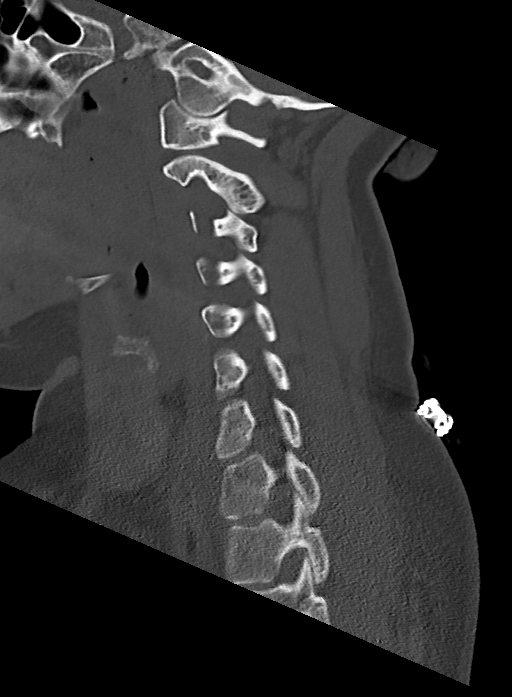
[im 41/61  bone]
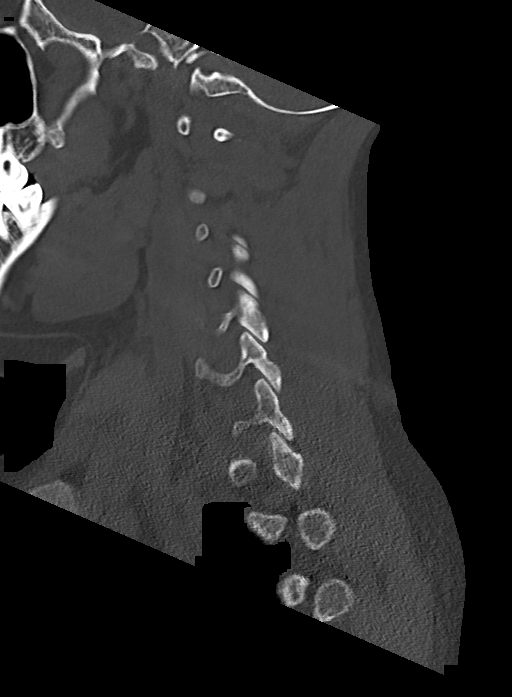

[Series 7: c_spine 2.0 cor bone · coronal · 0.26mm/px · 3 of 80 slices shown]
[im 16/80  bone]
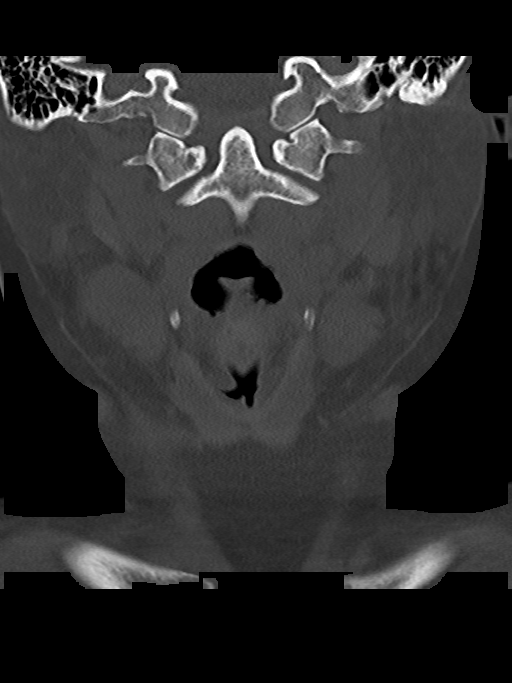
[im 32/80  bone]
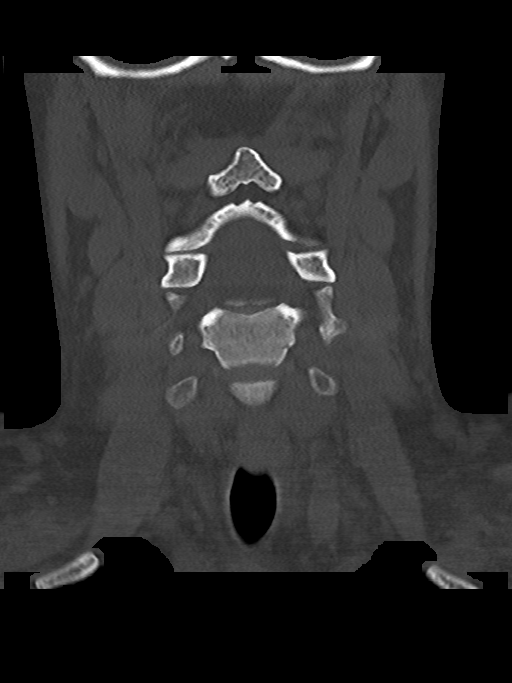
[im 48/80  bone]
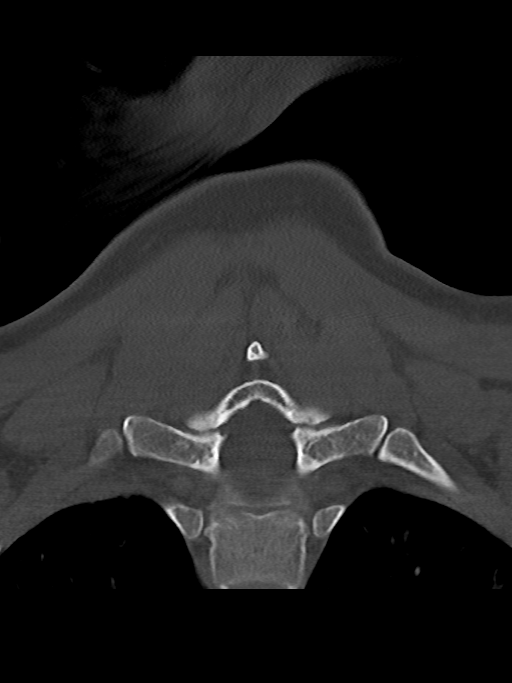

[Series 8: c_spine 2.0 orthogonals · axial · 0.21mm/px · z∈[+1165,+1255]mm · 5 of 81 slices shown, 7 images]
[im 12/81  soft-tissue]
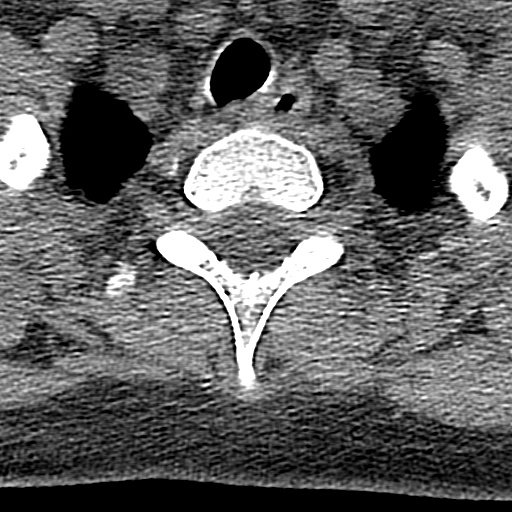
[im 12/81  bone]
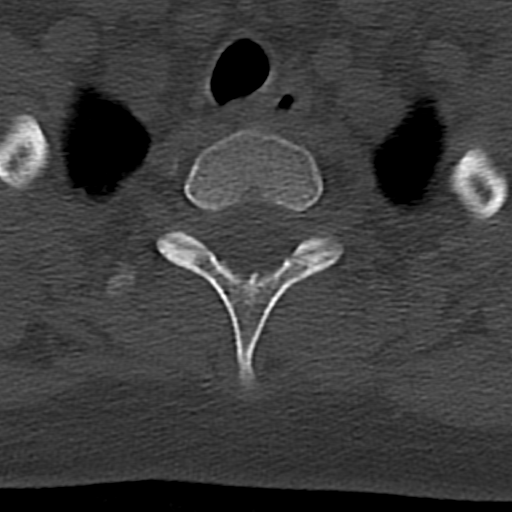
[im 23/81  bone]
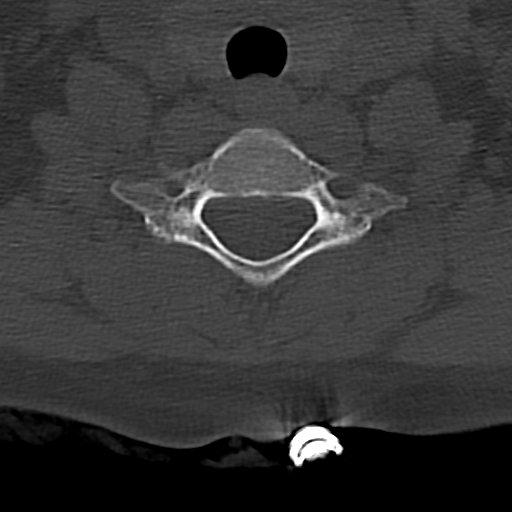
[im 46/81  bone]
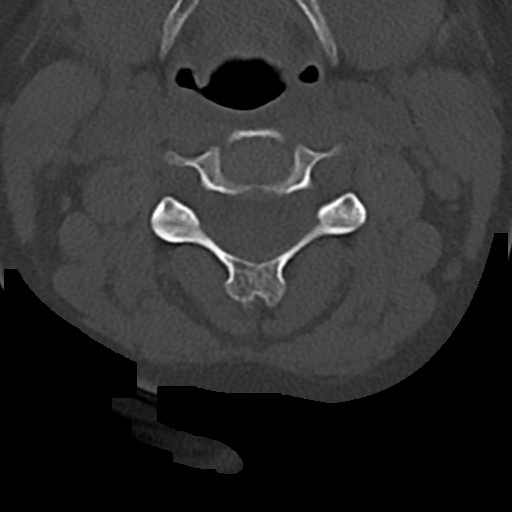
[im 58/81  bone]
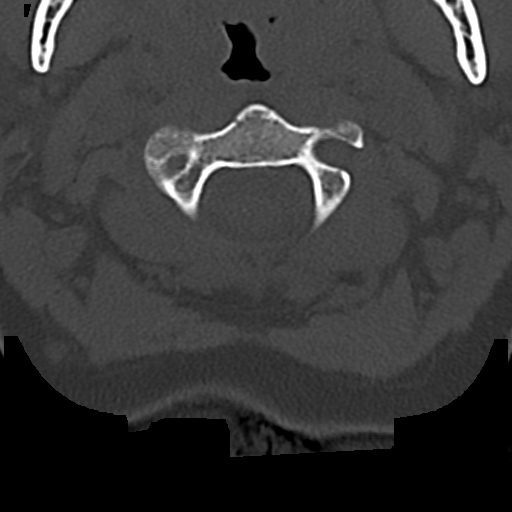
[im 69/81  soft-tissue]
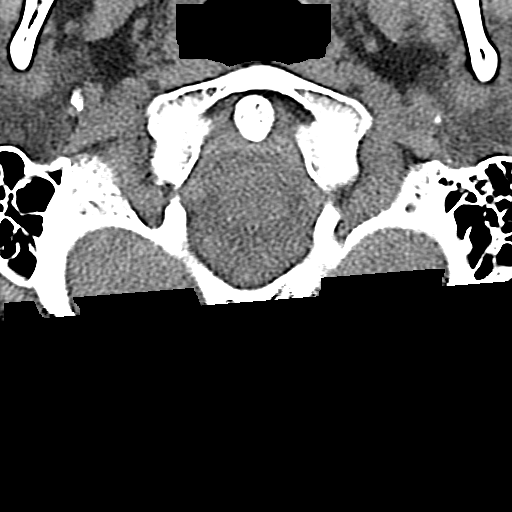
[im 69/81  bone]
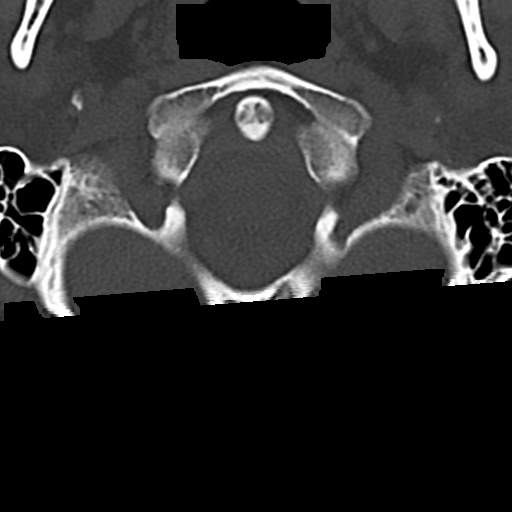

[13 of 33 positions shown; findings below may reference images not displayed]

FINDINGS: CT HEAD FINDINGS

Brain: There is no evidence of acute intracranial hemorrhage,
extra-axial fluid collection, or acute infarct.

Parenchymal volume is normal. The ventricles are normal in size.
Gray-white differentiation is preserved. There is no mass lesion.
There is no mass effect or midline shift.

Vascular: No hyperdense vessel or unexpected calcification.

Skull: Normal. Negative for fracture or focal lesion.

Sinuses/Orbits: Imaged paranasal sinuses are clear. The globes and
orbits are unremarkable.

Other: None.

CT CERVICAL SPINE FINDINGS

Alignment: There is straightening of the normal cervical spine
lordosis. There is no antero or retrolisthesis. There is no jumped
or perched facets or other evidence of traumatic malalignment.

Skull base and vertebrae: Skull base alignment is maintained.
Vertebral body heights are preserved. There is no evidence of acute
fracture. There is no suspicious osseous lesion.

Soft tissues and spinal canal: No prevertebral fluid or swelling. No
visible canal hematoma.

Disc levels: Disc heights are preserved. There is no significant
spinal canal or neural foraminal stenosis.

Upper chest: The lungs are assessed on the separately dictated CT
chest.

Other: None.
IMPRESSION: 1. No acute intracranial hemorrhage or calvarial fracture.
2. No acute fracture or traumatic malalignment of the cervical
spine.

## 2023-12-18 ENCOUNTER — Ambulatory Visit: Payer: Self-pay | Admitting: Family Medicine

## 2023-12-25 ENCOUNTER — Ambulatory Visit: Payer: Self-pay | Admitting: Family Medicine

## 2024-04-09 ENCOUNTER — Ambulatory Visit: Payer: Self-pay | Admitting: Family Medicine

## 2024-04-15 ENCOUNTER — Encounter: Payer: Self-pay | Admitting: Family Medicine

## 2024-04-15 ENCOUNTER — Ambulatory Visit (INDEPENDENT_AMBULATORY_CARE_PROVIDER_SITE_OTHER): Payer: Self-pay | Admitting: Family Medicine

## 2024-04-15 ENCOUNTER — Telehealth: Payer: Self-pay

## 2024-04-15 ENCOUNTER — Other Ambulatory Visit: Payer: Self-pay | Admitting: Family Medicine

## 2024-04-15 VITALS — BP 121/76 | HR 86 | Resp 16 | Ht 59.45 in | Wt 106.2 lb

## 2024-04-15 DIAGNOSIS — Z3009 Encounter for other general counseling and advice on contraception: Secondary | ICD-10-CM

## 2024-04-15 DIAGNOSIS — D500 Iron deficiency anemia secondary to blood loss (chronic): Secondary | ICD-10-CM

## 2024-04-15 DIAGNOSIS — N921 Excessive and frequent menstruation with irregular cycle: Secondary | ICD-10-CM

## 2024-04-15 DIAGNOSIS — R638 Other symptoms and signs concerning food and fluid intake: Secondary | ICD-10-CM

## 2024-04-15 DIAGNOSIS — Z30011 Encounter for initial prescription of contraceptive pills: Secondary | ICD-10-CM

## 2024-04-15 DIAGNOSIS — Z30013 Encounter for initial prescription of injectable contraceptive: Secondary | ICD-10-CM

## 2024-04-15 LAB — POCT URINE PREGNANCY: Preg Test, Ur: NEGATIVE

## 2024-04-15 MED ORDER — MEDROXYPROGESTERONE ACETATE 150 MG/ML IM SUSP
150.0000 mg | Freq: Once | INTRAMUSCULAR | Status: AC
  Administered 2024-04-15: 150 mg via INTRAMUSCULAR

## 2024-04-15 MED ORDER — DRONABINOL 5 MG/ML PO SOLN: 2.1000 mg | Freq: Two times a day (BID) | ORAL | 0 refills | Status: DC

## 2024-04-15 MED ORDER — DRONABINOL 2.5 MG PO CAPS: 2.5000 mg | ORAL_CAPSULE | Freq: Two times a day (BID) | ORAL | 0 refills | Status: DC

## 2024-04-15 NOTE — Telephone Encounter (Unsigned)
 Copied from CRM [id number]. Topic: Clinical - Medication Question >> Apr 15, 2024  1:23 PM Avram MATSU wrote: Reason for CRM: her main primary pharmacy does not have dronabinol (MARINOL) 2.5 MG capsule [[id number]] in stock. She is also requesting the liquid instead of gel rx if possible. If the liquid is not available the gel would be find.  Please advise [phone]  [facility] [address] [place], [place] [address] [phone]

## 2024-04-15 NOTE — Progress Notes (Signed)
 Established Patient Office Visit  Subjective  Patient ID: Candace Watkins, female    DOB: 1996-02-25  Age: 28 y.o. MRN: [id number]  Chief Complaint  Patient presents with   Contraception    Discuss Nexplanon   Discussed the use of AI scribe software for clinical note transcription with the patient, who gave verbal consent to proceed.  History of Present Illness   Candace Watkins is a 28 year old female who presents for discussion of birth control options and irregular menstrual cycles.  She is considering the Nexplanon implant, which she has used in the past, but is currently seeking a Depo-Provera  shot as an interim solution. Her last menstrual cycle was approximately one and a half weeks ago, but she experiences irregular and frequent periods, occurring two to three times per month, with heavy bleeding. This irregularity began after her last Depo shot expired, which she received in April 2025.  She has a history of anemia and is experiencing symptoms of dizziness and lightheadedness, which she attributes to the frequent and heavy bleeding. The patient does not believe she is pregnant.  She reports significant unintentional weight loss of approximately 20 pounds since December 2024, which she attributes to a decreased appetite. She acknowledges a period of increased stress over the past few months, which may have contributed to her reduced food intake. Her current eating pattern includes breakfast but often skips meals until late evening.  She has previously used protein shakes to gain weight but finds them challenging to incorporate into her diet as they often replace meals rather than supplement them. She is uncomfortable with her current weight and is seeking ways to stimulate her appetite.        04/15/2024   11:36 AM 09/25/2023   11:07 AM 01/15/2023    9:08 AM 05/03/2018   10:41 AM  GAD 7 : Generalized Anxiety Score  Nervous, Anxious, on Edge 1 1 0 1   Control/stop worrying 0 0 0 1  Worry too much - different things 0 0 1 0  Trouble relaxing 0 0 0 1  Restless 0 0 0 0  Easily annoyed or irritable 1 0 0 1  Afraid - awful might happen 0 0 0 0  Total GAD 7 Score 2 1 1 4   Anxiety Difficulty  Not difficult at all Not difficult at all       04/15/2024   11:37 AM 09/25/2023   11:06 AM 01/15/2023    9:08 AM  PHQ9 SCORE ONLY  PHQ-9 Total Score 5 0  0      Data saved with a previous flowsheet row definition   ROS: see HPI     Objective:     BP 121/76   Pulse 86   Resp 16   Ht 4' 11.45 (1.51 m)   Wt 106 lb 3.2 oz (48.2 kg)   LMP 04/08/2024   SpO2 96%   BMI 21.13 kg/m  BP Readings from Last 3 Encounters:  04/15/24 121/76  09/25/23 114/76  06/13/23 117/70     Physical Exam Vitals reviewed.  Constitutional:      Appearance: Normal appearance.  Cardiovascular:     Rate and Rhythm: Normal rate and regular rhythm.     Pulses: Normal pulses.     Heart sounds: Normal heart sounds.  Pulmonary:     Effort: Pulmonary effort is normal.     Breath sounds: Normal breath sounds.  Neurological:     Mental Status: She is alert.  Psychiatric:        Mood and Affect: Mood normal.        Behavior: Behavior normal.     Assessment & Plan:   1. Excessive menstruation with irregular cycle (Primary) Heavy and frequent menstrual bleeding likely due to cessation of Depo-Provera , impacting anemia. Perform urine pregnancy test before administering Depo-Provera . POCT urine pregnancy test in office is negative. Administer Depo-Provera  to regulate cycles and reduce bleeding.  2. Encounter for initial prescription of injectable contraceptive Prefers Depo-Provera  today, interested in transitioning to Nexplanon. Nexplanon insertions not available here. Administer Depo-Provera  injection today. Refer to OB/GYN for Nexplanon insertion. - Ambulatory referral to Obstetrics / Gynecology - POCT urine pregnancy - medroxyPROGESTERone  (DEPO-PROVERA )  injection 150 mg  3. Difficulty maintaining weight Unintentional weight loss and decreased appetite. Significant weight loss and decreased appetite possibly due to stress and anxiety. Currently at healthy BMI but concerned about further loss. Prescribe dronabinol 2.5 mg twice daily to stimulate appetite. Encourage increased protein intake and consider protein shakes. Discuss referral to a nutritionist if needed.     4. Iron  deficiency anemia due to chronic blood loss Anemia potentially worsened by heavy menstrual bleeding, causing dizziness and lightheadedness. Monitor symptoms of dizziness and lightheadedness. Administer Depo-Provera  to reduce menstrual bleeding.   Return in about 4 weeks (around 05/13/2024) for weight gain f/u .    Evalene Arts, FNP

## 2024-04-15 NOTE — Patient Instructions (Signed)

## 2024-04-18 ENCOUNTER — Telehealth: Payer: Self-pay | Admitting: Family Medicine

## 2024-04-18 NOTE — Telephone Encounter (Signed)
 Requested medication sent to PCP for authorization.

## 2024-04-18 NOTE — Telephone Encounter (Unsigned)
 Copied from CRM [id number]. Topic: Clinical - Medication Question >> Apr 15, 2024  1:23 PM Avram MATSU wrote: Reason for CRM: her main primary pharmacy does not have dronabinol (MARINOL) 2.5 MG capsule [[id number]] in stock. She is also requesting the liquid instead of gel rx if possible. If the liquid is not available the gel would be find.  Please advise [phone]  [facility] [address] [place], [place] [address] [phone] >> Apr 18, 2024  1:03 PM Winona R wrote: VDI dropped while assisting pt. I notice pt has already been assisted once I've completed trouble shooting >> Apr 18, 2024 12:57 PM Montie POUR wrote: Medication droNABinol 5 MG/ML SOLN [facility]. Please [facility] to [facility] listed [facility] to have it refilled. [facility] does not have medication and cannot get the medication. Please call Colisha at [phone] with any questions.

## 2024-04-18 NOTE — Telephone Encounter (Signed)
 Copied from CRM [id number]. Topic: Clinical - Medication Question >> Apr 15, 2024  1:23 PM Avram MATSU wrote: Reason for CRM: her main primary pharmacy does not have dronabinol (MARINOL) 2.5 MG capsule [[id number]] in stock. She is also requesting the liquid instead of gel rx if possible. If the liquid is not available the gel would be find.  Please advise [phone]  [facility] [address] [place], [place] [address] [phone] >> Apr 18, 2024 12:57 PM Montie POUR wrote: Medication droNABinol 5 MG/ML SOLN [facility] not [facility]. Please [facility] it to [facility] to have it refilled. [facility] does not have medication and cannot get the medication. Please call Lexianna at [phone] with any questions.

## 2024-04-18 NOTE — Addendum Note (Signed)
 Addended by: RAYANN REXENE HERO on: 04/18/2024 02:16 PM   Modules accepted: Orders

## 2024-04-21 ENCOUNTER — Telehealth: Payer: Self-pay | Admitting: Family Medicine

## 2024-04-21 ENCOUNTER — Other Ambulatory Visit: Payer: Self-pay | Admitting: Family Medicine

## 2024-04-21 DIAGNOSIS — R638 Other symptoms and signs concerning food and fluid intake: Secondary | ICD-10-CM

## 2024-04-21 MED ORDER — DRONABINOL 5 MG/ML PO SOLN: 2.1000 mg | Freq: Two times a day (BID) | ORAL | 0 refills | Status: DC

## 2024-04-21 NOTE — Progress Notes (Signed)
 PDMP reviewed, no red flags present. [facility] [place].

## 2024-04-21 NOTE — Telephone Encounter (Signed)
 Copied from CRM [id number]. Topic: Clinical - Medication Question >> Apr 15, 2024  1:23 PM Avram MATSU wrote: Reason for CRM: her main primary pharmacy does not have dronabinol (MARINOL) 2.5 MG capsule [[id number]] in stock. She is also requesting the liquid instead of gel rx if possible. If the liquid is not available the gel would be find.  Please advise [phone]  [facility] [address] [place], [place] [address] [phone]

## 2024-04-21 NOTE — Telephone Encounter (Unsigned)
 Copied from CRM [id number]. Topic: Clinical - Medication Question >> Apr 15, 2024  1:23 PM Avram MATSU wrote: Reason for CRM: her main primary pharmacy does not have dronabinol (MARINOL) 2.5 MG capsule [[id number]] in stock. She is also requesting the liquid instead of gel rx if possible. If the liquid is not available the gel would be find.  Please advise [phone]  [facility] [address] [place], [place] [address] [phone] >> Apr 21, 2024  9:33 AM Avram MATSU wrote: Pt is calling about droNABinol 5 MG/ML SOLN [[id number]  [facility]. Please advise  [address] [facility] [address] >> Apr 18, 2024  1:03 PM Winona R wrote: VDI dropped while assisting pt. I notice pt has already been assisted once I've completed trouble shooting >> Apr 18, 2024 12:57 PM Montie POUR wrote: Medication droNABinol 5 MG/ML SOLN has not been [facility] to [facility]. Please send it to [facility] listed [facility] to have it refilled. [facility] does not have medication and cannot get the medication. Please call Kaveri at [phone] with any questions.

## 2024-04-29 ENCOUNTER — Other Ambulatory Visit: Payer: Self-pay | Admitting: Family Medicine

## 2024-04-29 MED ORDER — DRONABINOL 2.5 MG PO CAPS: 2.5000 mg | ORAL_CAPSULE | Freq: Two times a day (BID) | ORAL | 0 refills | Status: DC

## 2024-05-01 ENCOUNTER — Other Ambulatory Visit: Payer: Self-pay | Admitting: Family Medicine

## 2024-05-01 NOTE — Telephone Encounter (Unsigned)
 Copied from CRM #[id number]. Topic: Clinical - Medication Refill >> May 01, 2024  3:21 PM Tiffini S wrote: Medication: dronabinol (MARINOL) 2.5 MG capsule-  patient called pharmacy and they have the medication is stock    Has the patient contacted their pharmacy? Yes (Agent: If no, request that the patient contact the pharmacy for the refill. If patient does not wish to contact the pharmacy document the reason why and proceed with request.) (Agent: If yes, when and what did the pharmacy advise?)  This is the patient's preferred pharmacy:   [facility] [address]  [place], [place] [address] [phone]  Is this the correct pharmacy for this prescription? Yes If no, delete pharmacy and type the correct one.   Has the prescription been filled recently? Yes  Is the patient out of the medication? Yes  Has the patient been seen for an appointment in the last year OR does the patient have an upcoming appointment? Yes  Can we respond through MyChart? Yes  Agent: Please be advised that Rx refills may take up to 3 business days. We ask that you follow-up with your pharmacy.

## 2024-05-02 MED ORDER — DRONABINOL 2.5 MG PO CAPS: 2.5000 mg | ORAL_CAPSULE | Freq: Two times a day (BID) | ORAL | 0 refills | Status: DC

## 2024-05-05 ENCOUNTER — Other Ambulatory Visit: Payer: Self-pay | Admitting: Family Medicine

## 2024-05-05 ENCOUNTER — Other Ambulatory Visit: Payer: Self-pay

## 2024-05-05 DIAGNOSIS — R638 Other symptoms and signs concerning food and fluid intake: Secondary | ICD-10-CM | POA: Insufficient documentation

## 2024-05-05 MED ORDER — MIRTAZAPINE 7.5 MG PO TABS: 7.5000 mg | ORAL_TABLET | Freq: Every day | ORAL | 2 refills | Status: DC

## 2024-05-06 ENCOUNTER — Ambulatory Visit (INDEPENDENT_AMBULATORY_CARE_PROVIDER_SITE_OTHER): Payer: Self-pay | Admitting: Family Medicine

## 2024-05-06 ENCOUNTER — Encounter: Payer: Self-pay | Admitting: Family Medicine

## 2024-05-06 VITALS — BP 115/75 | HR 94 | Resp 16 | Ht 59.45 in | Wt 105.0 lb

## 2024-05-06 DIAGNOSIS — R638 Other symptoms and signs concerning food and fluid intake: Secondary | ICD-10-CM

## 2024-05-06 MED ORDER — DRONABINOL 2.5 MG PO CAPS: 2.5000 mg | ORAL_CAPSULE | Freq: Two times a day (BID) | ORAL | 0 refills | Status: DC

## 2024-05-06 MED ORDER — DRONABINOL 2.5 MG PO CAPS: 2.5000 mg | ORAL_CAPSULE | Freq: Two times a day (BID) | ORAL | 0 refills | Status: AC

## 2024-05-06 NOTE — Progress Notes (Unsigned)
 Acute Care Office Visit  Subjective:   Candace Watkins Sep 19, 1995 05/06/2024   HPI:  Discussed the use of AI scribe software for clinical note transcription with the patient, who gave verbal consent to proceed.  History of Present Illness   Candace Watkins is a 28 year old female who presents with difficulty gaining weight.  She has experienced a significant decrease in weight, with her average weight typically around 120 pounds and a maximum of 130 pounds. Her weight has dropped to 106 pounds at her last visit and is currently 105 pounds. Despite efforts, she is unable to gain weight.  She previously tried an appetite stimulant a few years ago, which was effective but is now banned. She is considering dronabinol as an alternative but has faced issues with pharmacies not receiving the prescription due to manufacturer or system problems. The [facility] pharmacy had it but encountered system issues, while another pharmacy could not order it.  She has been on the Depo-Provera  shot for birth control, hoping it would help with weight gain, as she has heard from others that it can lead to weight gain. However, she feels it has had the opposite effect on her. She is currently on her second shot, which she started approximately three months ago.   The following portions of the patient's history were reviewed and updated as appropriate: past medical history, past surgical history, family history, social history, allergies, medications, and problem list.   Patient Active Problem List   Diagnosis Date Noted   Difficulty maintaining weight 05/05/2024   Fatigue 03/16/2023   Iron  deficiency anemia 01/29/2023   Dysphagia 01/29/2023   [redacted] weeks gestation of pregnancy 12/22/2021   Unwanted pregnancy with plans for termination 12/22/2021   Past Medical History:  Diagnosis Date   Anemia 07/05/2013   Started on iron  1/10     Presence of subdermal  contraceptive device 11/13/2013   Rubella non-immune status, antepartum 07/09/2013   Past Surgical History:  Procedure Laterality Date   NO PAST SURGERIES     Family History  Problem Relation Age of Onset   Diabetes Mother    Hypertension Mother    Hyperlipidemia Father    Diabetes Paternal Grandmother    Outpatient Medications Prior to Visit  Medication Sig Dispense Refill   mirtazapine (REMERON) 7.5 MG tablet Take 1 tablet (7.5 mg total) by mouth at bedtime. (Patient not taking: Reported on 05/06/2024) 30 tablet 2   No facility-administered medications prior to visit.   No Known Allergies  ROS: A complete ROS was performed with pertinent positives/negatives noted in the HPI. The remainder of the ROS are negative.    Objective:   Today's Vitals   05/06/24 1539  BP: 115/75  Pulse: 94  Resp: 16  SpO2: 98%  Weight: 105 lb (47.6 kg)  Height: 4' 11.45 (1.51 m)  PainSc: 0-No pain    GENERAL: Well-appearing, in NAD. Well nourished.  SKIN: Pink, warm and dry. No rash, lesion, ulceration, or ecchymoses.  Head: Normocephalic. NECK: Trachea midline. Full ROM w/o pain or tenderness. No lymphadenopathy.  EARS: Tympanic membranes are intact, translucent without bulging and without drainage. Appropriate landmarks visualized.  EYES: Conjunctiva clear without exudates. EOMI, PERRL, no drainage present.  NOSE: Septum midline w/o deformity. Nares patent, mucosa pink and non-inflamed w/o drainage. No sinus tenderness.  THROAT: Uvula midline. Oropharynx clear. Tonsils non-inflamed without exudate. Mucous membranes pink and moist.  RESPIRATORY: Chest wall symmetrical. Respirations even and non-labored. Breath  sounds clear to auscultation bilaterally.  CARDIAC: S1, S2 present, regular rate and rhythm without murmur or gallops. Peripheral pulses 2+ bilaterally.  MSK: Muscle tone and strength appropriate for age. Joints w/o tenderness, redness, or swelling.  EXTREMITIES: Without clubbing,  cyanosis, or edema.  NEUROLOGIC: No motor or sensory deficits. Steady, even gait. C2-C12 intact.  PSYCH/MENTAL STATUS: Alert, oriented x 3. Cooperative, appropriate mood and affect.      Assessment & Plan:   1. Difficulty maintaining weight (Primary) Unintentional weight loss and decreased appetite. Significant weight loss from 120 lbs to 105 lbs in the past few months, with decreased appetite. Dronabinol gel capsules preferred for cost over solution. Mirtazapine considered if dronabinol prescription issues arise. Birth control shot may aid weight gain after 6-12 months. Provided paper prescription for pharmacy access, due to ongoing issues with electronic transmission. Discussed mirtazapine as alternative if dronabinol issues persist. -dronabinol (MARINOL) 2.5 MG capsule; Take 1 capsule (2.5 mg total) by mouth 2 (two) times daily before a meal.  Dispense: 60 capsule; Refill: 0  Meds ordered this encounter  Medications   DISCONTD: dronabinol (MARINOL) 2.5 MG capsule    Sig: Take 1 capsule (2.5 mg total) by mouth 2 (two) times daily before a meal.    Dispense:  60 capsule    Refill:  0    Supervising Provider:   ZIGLAR, SUSAN K [[id number]]   dronabinol (MARINOL) 2.5 MG capsule    Sig: Take 1 capsule (2.5 mg total) by mouth 2 (two) times daily before a meal.    Dispense:  60 capsule    Refill:  0    Supervising Provider:   ZIGLAR, SUSAN K [[id number]]    Return in about 8 weeks (around 07/01/2024) for weight gain .   Patient to reach out to office if new, worrisome, or unresolved symptoms arise or if no improvement in patient's condition. Patient verbalized understanding and is agreeable to treatment plan. All questions answered to patient's satisfaction.    Evalene Arts, FNP

## 2024-05-08 ENCOUNTER — Other Ambulatory Visit: Payer: Self-pay | Admitting: Family Medicine

## 2024-09-25 ENCOUNTER — Encounter: Payer: Self-pay | Admitting: Family Medicine
